# Patient Record
Sex: Female | Born: 1970
Health system: Southern US, Community
[De-identification: ages and names within clinical notes are randomized; demographics above are authoritative.]

## PROBLEM LIST (undated history)

## (undated) DIAGNOSIS — Z973 Presence of spectacles and contact lenses: Secondary | ICD-10-CM

## (undated) DIAGNOSIS — N939 Abnormal uterine and vaginal bleeding, unspecified: Secondary | ICD-10-CM

## (undated) DIAGNOSIS — D259 Leiomyoma of uterus, unspecified: Secondary | ICD-10-CM

## (undated) HISTORY — PX: AUGMENTATION MAMMAPLASTY: SUR837

---

## 1997-07-24 ENCOUNTER — Other Ambulatory Visit: Admission: RE | Admit: 1997-07-24 | Discharge: 1997-07-24 | Payer: Self-pay | Admitting: Obstetrics and Gynecology

## 1997-10-23 ENCOUNTER — Other Ambulatory Visit: Admission: RE | Admit: 1997-10-23 | Discharge: 1997-10-23 | Payer: Self-pay | Admitting: Obstetrics and Gynecology

## 1998-06-21 ENCOUNTER — Other Ambulatory Visit: Admission: RE | Admit: 1998-06-21 | Discharge: 1998-06-21 | Payer: Self-pay | Admitting: Obstetrics and Gynecology

## 1999-07-03 ENCOUNTER — Other Ambulatory Visit: Admission: RE | Admit: 1999-07-03 | Discharge: 1999-07-03 | Payer: Self-pay | Admitting: Obstetrics and Gynecology

## 2000-07-15 ENCOUNTER — Ambulatory Visit (HOSPITAL_COMMUNITY): Admission: RE | Admit: 2000-07-15 | Discharge: 2000-07-15 | Payer: Self-pay | Admitting: Obstetrics and Gynecology

## 2000-08-04 ENCOUNTER — Other Ambulatory Visit: Admission: RE | Admit: 2000-08-04 | Discharge: 2000-08-04 | Payer: Self-pay | Admitting: Obstetrics and Gynecology

## 2001-02-25 ENCOUNTER — Inpatient Hospital Stay (HOSPITAL_COMMUNITY): Admission: AD | Admit: 2001-02-25 | Discharge: 2001-02-28 | Payer: Self-pay | Admitting: Obstetrics and Gynecology

## 2001-04-12 ENCOUNTER — Other Ambulatory Visit: Admission: RE | Admit: 2001-04-12 | Discharge: 2001-04-12 | Payer: Self-pay | Admitting: Obstetrics and Gynecology

## 2002-03-24 ENCOUNTER — Other Ambulatory Visit: Admission: RE | Admit: 2002-03-24 | Discharge: 2002-03-24 | Payer: Self-pay | Admitting: Obstetrics and Gynecology

## 2002-07-14 ENCOUNTER — Inpatient Hospital Stay (HOSPITAL_COMMUNITY): Admission: AD | Admit: 2002-07-14 | Discharge: 2002-07-14 | Payer: Self-pay | Admitting: Obstetrics and Gynecology

## 2002-10-16 ENCOUNTER — Inpatient Hospital Stay (HOSPITAL_COMMUNITY): Admission: AD | Admit: 2002-10-16 | Discharge: 2002-10-18 | Payer: Self-pay | Admitting: Obstetrics and Gynecology

## 2002-12-01 ENCOUNTER — Other Ambulatory Visit: Admission: RE | Admit: 2002-12-01 | Discharge: 2002-12-01 | Payer: Self-pay | Admitting: Obstetrics and Gynecology

## 2004-05-29 ENCOUNTER — Other Ambulatory Visit: Admission: RE | Admit: 2004-05-29 | Discharge: 2004-05-29 | Payer: Self-pay | Admitting: Obstetrics and Gynecology

## 2004-08-15 ENCOUNTER — Encounter: Admission: RE | Admit: 2004-08-15 | Discharge: 2004-08-15 | Payer: Self-pay | Admitting: Obstetrics and Gynecology

## 2006-07-31 ENCOUNTER — Encounter: Admission: RE | Admit: 2006-07-31 | Discharge: 2006-07-31 | Payer: Self-pay | Admitting: Obstetrics and Gynecology

## 2007-03-04 HISTORY — PX: INGUINAL HERNIA REPAIR: SUR1180

## 2011-03-04 HISTORY — PX: BREAST ENHANCEMENT SURGERY: SHX7

## 2011-05-19 ENCOUNTER — Encounter: Payer: Self-pay | Admitting: Family

## 2011-05-19 ENCOUNTER — Ambulatory Visit (INDEPENDENT_AMBULATORY_CARE_PROVIDER_SITE_OTHER): Payer: BC Managed Care – PPO | Admitting: Family

## 2011-05-19 VITALS — BP 100/60 | Ht 62.0 in | Wt 106.0 lb

## 2011-05-19 DIAGNOSIS — R634 Abnormal weight loss: Secondary | ICD-10-CM

## 2011-05-19 DIAGNOSIS — F4389 Other reactions to severe stress: Secondary | ICD-10-CM

## 2011-05-19 DIAGNOSIS — F438 Other reactions to severe stress: Secondary | ICD-10-CM

## 2011-05-19 DIAGNOSIS — F430 Acute stress reaction: Secondary | ICD-10-CM

## 2011-05-19 LAB — CBC
HCT: 41.9 % (ref 36.0–46.0)
MCHC: 34 g/dL (ref 30.0–36.0)
MCV: 102.4 fl — ABNORMAL HIGH (ref 78.0–100.0)
RDW: 12.4 % (ref 11.5–14.6)

## 2011-05-19 LAB — BASIC METABOLIC PANEL
CO2: 30 mEq/L (ref 19–32)
Calcium: 9.3 mg/dL (ref 8.4–10.5)
Chloride: 103 mEq/L (ref 96–112)
GFR: 82.77 mL/min (ref >60.00)
Glucose, Bld: 69 mg/dL — ABNORMAL LOW (ref 70–99)
Sodium: 139 mEq/L (ref 135–145)

## 2011-05-19 NOTE — Patient Instructions (Addendum)
Attention Deficit Hyperactivity Disorder Attention deficit hyperactivity disorder (ADHD) is a problem with behavior issues based on the way the brain functions (neurobehavioral disorder). It is a common reason for behavior and academic problems in school. CAUSES  The cause of ADHD is unknown in most cases. It may run in families. It sometimes can be associated with learning disabilities and other behavioral problems. SYMPTOMS  There are 3 types of ADHD. The 3 types and some of the symptoms include:  Inattentive   Gets bored or distracted easily.   Loses or forgets things. Forgets to hand in homework.   Has trouble organizing or completing tasks.   Difficulty staying on task.   An inability to organize daily tasks and school work.   Leaving projects, chores, or homework unfinished.   Trouble paying attention or responding to details. Careless mistakes.   Difficulty following directions. Often seems like is not listening.   Dislikes activities that require sustained attention (like chores or homework).   Hyperactive-impulsive   Feels like it is impossible to sit still or stay in a seat. Fidgeting with hands and feet.   Trouble waiting turn.   Talking too much or out of turn. Interruptive.   Speaks or acts impulsively.   Aggressive, disruptive behavior.   Constantly busy or on the go, noisy.   Combined   Has symptoms of both of the above.  Often children with ADHD feel discouraged about themselves and with school. They often perform well below their abilities in school. These symptoms can cause problems in home, school, and in relationships with peers. As children get older, the excess motor activities can calm down, but the problems with paying attention and staying organized persist. Most children do not outgrow ADHD but with good treatment can learn to cope with the symptoms. DIAGNOSIS  When ADHD is suspected, the diagnosis should be made by professionals trained in  ADHD.  Diagnosis will include:  Ruling out other reasons for the child's behavior.   The caregivers will check with the child's school and check their medical records.   They will talk to teachers and parents.   Behavior rating scales for the child will be filled out by those dealing with the child on a daily basis.  A diagnosis is made only after all information has been considered. TREATMENT  Treatment usually includes behavioral treatment often along with medicines. It may include stimulant medicines. The stimulant medicines decrease impulsivity and hyperactivity and increase attention. Other medicines used include antidepressants and certain blood pressure medicines. Most experts agree that treatment for ADHD should address all aspects of the child's functioning. Treatment should not be limited to the use of medicines alone. Treatment should include structured classroom management. The parents must receive education to address rewarding good behavior, discipline, and limit-setting. Tutoring or behavioral therapy or both should be available for the child. If untreated, the disorder can have long-term serious effects into adolescence and adulthood. HOME CARE INSTRUCTIONS   Often with ADHD there is a lot of frustration among the family in dealing with the illness. There is often blame and anger that is not warranted. This is a life long illness. There is no way to prevent ADHD. In many cases, because the problem affects the family as a whole, the entire family may need help. A therapist can help the family find better ways to handle the disruptive behaviors and promote change. If the child is young, most of the therapist's work is with the parents. Parents will   learn techniques for coping with and improving their child's behavior. Sometimes only the child with the ADHD needs counseling. Your caregivers can help you make these decisions.   Children with ADHD may need help in organizing. Some  helpful tips include:   Keep routines the same every day from wake-up time to bedtime. Schedule everything. This includes homework and playtime. This should include outdoor and indoor recreation. Keep the schedule on the refrigerator or a bulletin board where it is frequently seen. Mark schedule changes as far in advance as possible.   Have a place for everything and keep everything in its place. This includes clothing, backpacks, and school supplies.   Encourage writing down assignments and bringing home needed books.   Offer your child a well-balanced diet. Breakfast is especially important for school performance. Children should avoid drinks with caffeine including:   Soft drinks.   Coffee.   Tea.   However, some older children (adolescents) may find these drinks helpful in improving their attention.   Children with ADHD need consistent rules that they can understand and follow. If rules are followed, give small rewards. Children with ADHD often receive, and expect, criticism. Look for good behavior and praise it. Set realistic goals. Give clear instructions. Look for activities that can foster success and self-esteem. Make time for pleasant activities with your child. Give lots of affection.   Parents are their children's greatest advocates. Learn as much as possible about ADHD. This helps you become a stronger and better advocate for your child. It also helps you educate your child's teachers and instructors if they feel inadequate in these areas. Parent support groups are often helpful. A national group with local chapters is called CHADD (Children and Adults with Attention Deficit Hyperactivity Disorder).  PROGNOSIS  There is no cure for ADHD. Children with the disorder seldom outgrow it. Many find adaptive ways to accommodate the ADHD as they mature. SEEK MEDICAL CARE IF:  Your child has repeated muscle twitches, cough or speech outbursts.   Your child has sleep problems.   Your  child has a marked loss of appetite.   Your child develops depression.   Your child has new or worsening behavioral problems.   Your child develops dizziness.   Your child has a racing heart.   Your child has stomach pains.   Your child develops headaches.  Document Released: 02/07/2002 Document Revised: 02/06/2011 Document Reviewed: 09/20/2007 Exit[employer] Patient Information 2012 [employer].   Anxiety and Panic Attacks Your caregiver has informed you that you are having an anxiety or panic attack. There may be many forms of this. Most of the time these attacks come suddenly and without warning. They come at any time of day, including periods of sleep, and at any time of life. They may be strong and unexplained. Although panic attacks are very scary, they are physically harmless. Sometimes the cause of your anxiety is not known. Anxiety is a protective mechanism of the body in its fight or flight mechanism. Most of these perceived danger situations are actually nonphysical situations (such as anxiety over losing a job). CAUSES  The causes of an anxiety or panic attack are many. Panic attacks may occur in otherwise healthy people given a certain set of circumstances. There may be a genetic cause for panic attacks. Some medications may also have anxiety as a side effect. SYMPTOMS  Some of the most common feelings are:  Intense terror.   Dizziness, feeling faint.   Hot and cold flashes.     Fear of going crazy.   Feelings that nothing is real.   Sweating.   Shaking.   Chest pain or a fast heartbeat (palpitations).   Smothering, choking sensations.   Feelings of impending doom and that death is near.   Tingling of extremities, this may be from over-breathing.   Altered reality (derealization).   Being detached from yourself (depersonalization).  Several symptoms can be present to make up anxiety or panic attacks. DIAGNOSIS  The evaluation by your caregiver will depend  on the type of symptoms you are experiencing. The diagnosis of anxiety or panic attack is made when no physical illness can be determined to be a cause of the symptoms. TREATMENT  Treatment to prevent anxiety and panic attacks may include:  Avoidance of circumstances that cause anxiety.   Reassurance and relaxation.   Regular exercise.   Relaxation therapies, such as yoga.   Psychotherapy with a psychiatrist or therapist.   Avoidance of caffeine, alcohol and illegal drugs.   Prescribed medication.  SEEK IMMEDIATE MEDICAL CARE IF:   You experience panic attack symptoms that are different than your usual symptoms.   You have any worsening or concerning symptoms.  Document Released: 02/17/2005 Document Revised: 02/06/2011 Document Reviewed: 06/21/2009 ExitCare Patient Information 2012 [employer]. 

## 2011-05-19 NOTE — Progress Notes (Signed)
  Subjective:    Patient ID: Sherry Mclean, female    DOB: 09/02/1970, 41 y.o.   MRN: [id number]  HPI 41 year old white female, nonsmoker, patient to the practice this and to discuss concerns possible attention deficit hyperactivity disorder. She reports going through some marital issues and her husband has verbalized he believes that she is too hyper. Reports being high-energy. Was never characterized his ADHD as a child. 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 Has no difficulty completing tasks at work or home. 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 Her husband and she separated 6 weeks ago. She is seeking help to address whatever her mood disorder may be. The patient does report recent weight loss of about 10 pounds. She attributed to the stress of her failing marriage. She routinely sees a Veterinary surgeon. She also sees a marriage counter. Patient will like to explore non-pharmacological ways to treat her mood disorder. Denies any helplessness, hopelessness, thoughts of death or dying.   Review of Systems  Respiratory: Negative.   Gastrointestinal: Negative.   Musculoskeletal: Negative.   Neurological: Negative.   Hematological: Negative.   Psychiatric/Behavioral: The patient is nervous/anxious and is hyperactive.    No past medical history on file.  History   Social History  . Marital Status: Single    Spouse Name: N/A    Number of Children: N/A  . Years of Education: N/A   Occupational History  . Not on file.   Social History Main Topics  . Smoking status: Never Smoker   . Smokeless tobacco: Not on file  . Alcohol Use: Yes     glass of wine nightly  . Drug Use: No  . Sexually Active: Not on file   Other Topics Concern  . Not on file   Social History Narrative  . No narrative on file    Past Surgical History  Procedure Date  . Hernia repair     No family history on file.  No Known Allergies  No current outpatient prescriptions on file prior to  visit.    BP 100/60  Ht 5\' 2"  (1.575 m)  Wt 106 lb (48.081 kg)  BMI 19.39 kg/m2chart    Objective:   Physical Exam  Constitutional: She is oriented to person, place, and time. She appears well-developed and well-nourished.  Neck: Normal range of motion. Neck supple.  Cardiovascular: Normal rate, regular rhythm and normal heart sounds.   Pulmonary/Chest: Effort normal and breath sounds normal.  Abdominal: Soft. Bowel sounds are normal.  Musculoskeletal: Normal range of motion.  Neurological: She is alert and oriented to person, place, and time.  Skin: Skin is warm and dry.  Psychiatric:       Appears anxious, and high-energy.          Assessment & Plan:  Assessment: Acute stress reaction, weight loss  Plan: I discussed at length attention deficit hyperactivity disorder and anxiety. Patient is very high strung. I believe that her symptoms more closely correlate with anxiety and ADD. I verbalized this to her. She will talk to her psychologist about this as well and will struck a plan for her. In the meantime, labs and to include TSH, BMP, CBC and B12. Will notify patient of results. Encouraged healthy diet and exercise. Avoid caffeine. Followup pending labs.

## 2011-05-20 ENCOUNTER — Telehealth: Payer: Self-pay | Admitting: Family

## 2011-05-20 NOTE — Telephone Encounter (Signed)
Pt called re: her ov on Friday at 3pm. Pt would like to discuss this will doctor prior to coming in for ov.

## 2011-05-21 NOTE — Telephone Encounter (Signed)
Spoke with patient. She is bringing her husband to the appointment but does not want the details of their separation addressed and her OV.

## 2011-05-23 ENCOUNTER — Ambulatory Visit (INDEPENDENT_AMBULATORY_CARE_PROVIDER_SITE_OTHER): Payer: BC Managed Care – PPO | Admitting: Family

## 2011-05-23 ENCOUNTER — Encounter: Payer: Self-pay | Admitting: Family

## 2011-05-23 VITALS — BP 120/80 | HR 91 | Temp 98.5°F

## 2011-05-23 DIAGNOSIS — F411 Generalized anxiety disorder: Secondary | ICD-10-CM

## 2011-05-23 DIAGNOSIS — F419 Anxiety disorder, unspecified: Secondary | ICD-10-CM

## 2011-05-23 NOTE — Progress Notes (Signed)
  Subjective:    Patient ID: Sherry Mclean, female    DOB: October 18, 1970, 41 y.o.   MRN: [id number]  HPI 41 year old white female, nonsmoker is in for recheck of acute stress reaction and anxiety. She has her husband here accompanying her who also has concerns of her being high stress. Husband also reports that she takes are multiple to ask, and can often feel overwhelmed then not complete the task. Overall, she is very meticulous person and very organized. I had an opportunity to speak with her therapist, who has diagnosed her with generalized anxiety disorder, and has recommended an SSRI.   Review of Systems  HENT: Negative.   Respiratory: Negative.   Cardiovascular: Negative.   Neurological: Negative.   Hematological: Negative.   Psychiatric/Behavioral: The patient is nervous/anxious.    No past medical history on file.  History   Social History  . Marital Status: Single    Spouse Name: N/A    Number of Children: N/A  . Years of Education: N/A   Occupational History  . Not on file.   Social History Main Topics  . Smoking status: Never Smoker   . Smokeless tobacco: Not on file  . Alcohol Use: Yes     glass of wine nightly  . Drug Use: No  . Sexually Active: Not on file   Other Topics Concern  . Not on file   Social History Narrative  . No narrative on file    Past Surgical History  Procedure Date  . Hernia repair     No family history on file.  No Known Allergies  Current Outpatient Prescriptions on File Prior to Visit  Medication Sig Dispense Refill  . norethindrone-ethinyl estradiol (JUNEL FE,GILDESS FE,LOESTRIN FE) 1-20 MG-MCG tablet Take 1 tablet by mouth daily.        BP 120/80  Pulse 91  Temp(Src) 98.5 F (36.9 C) (Oral)  SpO2 99%chart    Objective:   Physical Exam  Constitutional: She is oriented to person, place, and time. She appears well-developed and well-nourished.  Neck: Normal range of motion. Neck supple.  Cardiovascular: Normal rate  and normal heart sounds.   Pulmonary/Chest: Effort normal and breath sounds normal.  Musculoskeletal: Normal range of motion.  Neurological: She is alert and oriented to person, place, and time.  Skin: Skin is warm and dry.  Psychiatric: She has a normal mood and affect.          Assessment & Plan:  Assessment: Anxiety, acute stress reaction  Plan: After speaking with her and her husband, we have decided to try Pristiq 50 mg once a day. We'll bring patient back for recheck up to a half weeks. Advised to call with any questions or concerns. Recheck as discussed and when necessary sooner.

## 2011-05-27 ENCOUNTER — Telehealth: Payer: Self-pay

## 2011-05-27 NOTE — Telephone Encounter (Signed)
Pt called to c/o not being able to continue with the samples of Pristiq provided to her by PCP. She states that it made her feel "out of sorts", unsafe, tired, lots of yawning, and it felt like her throat was swollen or closing up. Pt notes that her throat was not sore or hurting. I advised pt to definitely d/c use of Pristiq and discuss other tx options with PCP at next OV. Pt verbalized understanding.

## 2011-05-27 NOTE — Telephone Encounter (Signed)
She may try a different medication. We can start a low dose, Celexa 10mg  QD if she is willing. #30 1 rf.

## 2011-05-28 NOTE — Telephone Encounter (Signed)
Pt stated that she would like to do more research on the active ingredients and side effects of Celexa before making a decision on starting medication. She will call back with her decision or wait to speak with PCP about it at next OV

## 2011-06-05 ENCOUNTER — Ambulatory Visit: Payer: Self-pay | Admitting: Family Medicine

## 2011-06-11 ENCOUNTER — Ambulatory Visit (INDEPENDENT_AMBULATORY_CARE_PROVIDER_SITE_OTHER): Payer: BC Managed Care – PPO | Admitting: Family

## 2011-06-11 ENCOUNTER — Encounter: Payer: Self-pay | Admitting: Family

## 2011-06-11 VITALS — BP 110/60 | Temp 98.6°F | Wt 104.0 lb

## 2011-06-11 DIAGNOSIS — F411 Generalized anxiety disorder: Secondary | ICD-10-CM

## 2011-06-11 DIAGNOSIS — F419 Anxiety disorder, unspecified: Secondary | ICD-10-CM

## 2011-06-11 NOTE — Progress Notes (Signed)
  Subjective:    Patient ID: Sherry Mclean, female    DOB: [birth year], 41 y.o.   MRN: [id number]  HPI 41 year old white female, nonsmoker is in for recheck of anxiety. She was started on Pristiq 50 mg once a day. Patient reports she did not tolerate the medication well. Reports side effects of fatigue, yawning, dilated pupils, and overall feeling drunk. She discontinued the medication after 3 doses. She wishes to try to control her anxiety her psychotherapy. She believes that she is in a better place today. She denies any feeling of helplessness, hopelessness, thoughts of death or dying.   Review of Systems  Constitutional: Negative.   Respiratory: Negative.   Cardiovascular: Negative.   Skin: Negative.   Neurological: Negative.   Hematological: Negative.   Psychiatric/Behavioral: Negative.        Objective:   Physical Exam  Constitutional: She is oriented to person, place, and time. She appears well-developed and well-nourished.  Cardiovascular: Normal rate, regular rhythm and normal heart sounds.   Pulmonary/Chest: Effort normal and breath sounds normal.  Neurological: She is alert and oriented to person, place, and time.  Skin: Skin is warm and dry.  Psychiatric: She has a normal mood and affect.          Assessment & Plan:  Assessment: Anxiety  Plan: Continue psychotherapy. Call the office if she decides to try another SSRI. Recheck when necessary. Encouraged exercise. No past medical history on file.  History   Social History  . Marital Status: Single    Spouse Name: N/A    Number of Children: N/A  . Years of Education: N/A   Occupational History  . Not on file.   Social History Main Topics  . Smoking status: Never Smoker   . Smokeless tobacco: Not on file  . Alcohol Use: Yes     glass of wine nightly  . Drug Use: No  . Sexually Active: Not on file   Other Topics Concern  . Not on file   Social History Narrative  . No narrative on file    Past  Surgical History  Procedure Date  . Hernia repair     No family history on file.  Allergies  Allergen Reactions  . Pristiq (Desvenlafaxine Succinate Monohydrate) Other (See Comments)    Pt states that her throat felt like it was swollen or "closing up"    Current Outpatient Prescriptions on File Prior to Visit  Medication Sig Dispense Refill  . norethindrone-ethinyl estradiol (JUNEL FE,GILDESS FE,LOESTRIN FE) 1-20 MG-MCG tablet Take 1 tablet by mouth daily.        BP 110/60  Temp(Src) 98.6 F (37 C) (Oral)  Wt 104 lb (47.174 kg)chart

## 2013-04-19 ENCOUNTER — Encounter (HOSPITAL_COMMUNITY): Payer: Self-pay

## 2013-05-02 NOTE — H&P (Signed)
Sherry Mclean  DICTATION # [id number] CSN# [id number]   Margarette Asal, MD 05/02/2013 2:12 PM

## 2013-05-03 NOTE — H&P (Signed)
NAMEMURLENE, REVELL NO.:  [id number]  MEDICAL RECORD NO.:  [id number]  LOCATION:                                 FACILITY:  PHYSICIAN:  Ralene Bathe. Matthew Saras, M.D.DATE OF BIRTH:  27-Jun-1970  DATE OF ADMISSION: DATE OF DISCHARGE:                             HISTORY & PHYSICAL   CHIEF COMPLAINT:  Menorrhagia.  HISTORY OF PRESENT ILLNESS:  A 43 year old, G2, P2, currently on Lo Loestrin, the patient is currently separated and has two children, 11 and 9, was seen recently, complaining of heavy irregular bleeding despite being on OCPs.  Sonohysterogram was carried out in our office on April 16, 2013, that showed multiple fibroids, 1.6, 1.9, 1.0.  On saline infusion, there was a well-defined mostly submucosal 2.8 x 1.4-cm intracavitary fibroid.  We discussed the number of treatment options with her arranging from definitive hysterectomy to [facility] resection of submucous fibroid.  She would prefer the latter.  This was discussed in detail including specific risk related to bleeding, infection, other complications such as perforation that may require additional surgery, the fact that the other fibroids could become an issue as far as pain, bleeding, or pressure later.  She understands that the main submucosal fibroid is the most likely problematic for causing her bleeding.  PAST MEDICAL HISTORY:  ALLERGIES:  BETADINE, AMOXICILLIN, AUGMENTIN.  CURRENT MEDICATIONS:  Lo Loestrin.  SURGICAL HISTORY:  She has had a hernia repair, breast augmentation in 2013.  Two vaginal deliveries.  REVIEW OF SYSTEMS:  Significant for hiatal hernia.  FAMILY HISTORY:  Significant for grandmother with unspecified cancer and her father was diagnosed in 2013, with colon cancer.  SOCIAL HISTORY:  She is separated.  Denies tobacco or drug use.  She does drink two alcoholic drinks per day.  PHYSICAL EXAMINATION:  VITAL SIGNS:  Temperature 98.2, blood pressure 102/70. HEENT:   Unremarkable. NECK:  Supple without masses. LUNGS:  Clear. CARDIOVASCULAR:  Regular rate and rhythm without murmurs, rubs, or gallops. BREASTS:  Reveals implants no masses. ABDOMEN:  Soft, flat, nontender.  Vulva, vagina, cervix normal.  Uterus is upper limit of normal size.  Adnexa negative. EXTREMITIES:  Unremarkable. NEUROLOGIC:  Unremarkable.  IMPRESSION:  Abnormal bleeding related to leiomyoma, submucous fibroid is noted.  PLAN:  D and C with TRUCLEAR resection of submucous fibroid.  Procedure and risks were discussed as above.     Darnella Zeiter M. Matthew Saras, M.D.     [facility]  D:  05/02/2013  T:  05/03/2013  Job:  [id number]

## 2013-05-04 ENCOUNTER — Encounter (HOSPITAL_COMMUNITY): Payer: Self-pay | Admitting: Pharmacist

## 2013-05-13 ENCOUNTER — Ambulatory Visit (HOSPITAL_COMMUNITY)
Admission: RE | Admit: 2013-05-13 | Discharge: 2013-05-13 | Disposition: A | Discharge status: Home / Self Care | Payer: BC Managed Care – PPO | Source: Ambulatory Visit | Attending: Obstetrics and Gynecology | Admitting: Obstetrics and Gynecology

## 2013-05-13 ENCOUNTER — Encounter (HOSPITAL_COMMUNITY): Payer: BC Managed Care – PPO | Admitting: Anesthesiology

## 2013-05-13 ENCOUNTER — Encounter (HOSPITAL_COMMUNITY)
Admission: RE | Disposition: A | Discharge status: Home / Self Care | Payer: Self-pay | Source: Ambulatory Visit | Attending: Obstetrics and Gynecology

## 2013-05-13 ENCOUNTER — Ambulatory Visit (HOSPITAL_COMMUNITY): Payer: BC Managed Care – PPO | Admitting: Anesthesiology

## 2013-05-13 ENCOUNTER — Encounter (HOSPITAL_COMMUNITY): Payer: Self-pay | Admitting: General Practice

## 2013-05-13 DIAGNOSIS — N920 Excessive and frequent menstruation with regular cycle: Secondary | ICD-10-CM | POA: Insufficient documentation

## 2013-05-13 DIAGNOSIS — D250 Submucous leiomyoma of uterus: Secondary | ICD-10-CM | POA: Insufficient documentation

## 2013-05-13 DIAGNOSIS — K449 Diaphragmatic hernia without obstruction or gangrene: Secondary | ICD-10-CM | POA: Insufficient documentation

## 2013-05-13 HISTORY — PX: DILATATION & CURETTAGE/HYSTEROSCOPY WITH TRUECLEAR: SHX6353

## 2013-05-13 LAB — CBC
HCT: 37.7 % (ref 36.0–46.0)
HEMOGLOBIN: 13.1 g/dL (ref 12.0–15.0)
MCH: 34.8 pg — ABNORMAL HIGH (ref 26.0–34.0)
MCHC: 34.7 g/dL (ref 30.0–36.0)
MCV: 100.3 fL — ABNORMAL HIGH (ref 78.0–100.0)
Platelets: 173 10*3/uL (ref 150–400)
RBC: 3.76 MIL/uL — ABNORMAL LOW (ref 3.87–5.11)
RDW: 12 % (ref 11.5–15.5)
WBC: 5.6 10*3/uL (ref 4.0–10.5)

## 2013-05-13 LAB — PREGNANCY, URINE: Preg Test, Ur: NEGATIVE

## 2013-05-13 SURGERY — DILATATION & CURETTAGE/HYSTEROSCOPY WITH TRUCLEAR
Anesthesia: General | Site: Vagina

## 2013-05-13 MED ORDER — LACTATED RINGERS IV SOLN
Freq: Once | INTRAVENOUS | Status: AC
  Administered 2013-05-13 (×2): via INTRAVENOUS

## 2013-05-13 MED ORDER — KETOROLAC TROMETHAMINE 30 MG/ML IJ SOLN: 15.0000 mg | Freq: Once | INTRAMUSCULAR | Status: DC | PRN

## 2013-05-13 MED ORDER — PROMETHAZINE HCL 25 MG/ML IJ SOLN
6.2500 mg | INTRAMUSCULAR | Status: DC | PRN
  Administered 2013-05-13: 6.25 mg via INTRAVENOUS

## 2013-05-13 MED ORDER — LIDOCAINE HCL 1 % IJ SOLN
INTRAMUSCULAR | Status: AC
  Filled 2013-05-13: qty 20

## 2013-05-13 MED ORDER — DEXAMETHASONE SODIUM PHOSPHATE 10 MG/ML IJ SOLN
INTRAMUSCULAR | Status: AC
  Filled 2013-05-13: qty 1

## 2013-05-13 MED ORDER — GLYCOPYRROLATE 0.2 MG/ML IJ SOLN
INTRAMUSCULAR | Status: AC
  Filled 2013-05-13: qty 1

## 2013-05-13 MED ORDER — GLYCOPYRROLATE 0.2 MG/ML IJ SOLN
INTRAMUSCULAR | Status: DC | PRN
  Administered 2013-05-13 (×2): 0.1 mg via INTRAVENOUS

## 2013-05-13 MED ORDER — FENTANYL CITRATE 0.05 MG/ML IJ SOLN
INTRAMUSCULAR | Status: AC
  Filled 2013-05-13: qty 2

## 2013-05-13 MED ORDER — SODIUM CHLORIDE 0.9 % IR SOLN
Status: DC | PRN
Start: 2013-05-13 — End: 2013-05-13
  Administered 2013-05-13: 3000 mL

## 2013-05-13 MED ORDER — ONDANSETRON HCL 4 MG/2ML IJ SOLN
INTRAMUSCULAR | Status: DC | PRN
  Administered 2013-05-13: 4 mg via INTRAVENOUS

## 2013-05-13 MED ORDER — MEPERIDINE HCL 25 MG/ML IJ SOLN: 6.2500 mg | INTRAMUSCULAR | Status: DC | PRN

## 2013-05-13 MED ORDER — KETOROLAC TROMETHAMINE 30 MG/ML IJ SOLN
INTRAMUSCULAR | Status: AC
  Filled 2013-05-13: qty 1

## 2013-05-13 MED ORDER — IBUPROFEN 800 MG PO TABS: 800.0000 mg | ORAL_TABLET | Freq: Three times a day (TID) | ORAL | Status: DC | PRN

## 2013-05-13 MED ORDER — FENTANYL CITRATE 0.05 MG/ML IJ SOLN
INTRAMUSCULAR | Status: DC | PRN
  Administered 2013-05-13: 25 ug via INTRAVENOUS
  Administered 2013-05-13: 50 ug via INTRAVENOUS
  Administered 2013-05-13: 25 ug via INTRAVENOUS

## 2013-05-13 MED ORDER — GENTAMICIN SULFATE 40 MG/ML IJ SOLN
INTRAVENOUS | Status: AC
  Administered 2013-05-13: 100 mL via INTRAVENOUS
  Filled 2013-05-13: qty 6.25

## 2013-05-13 MED ORDER — ONDANSETRON HCL 4 MG/2ML IJ SOLN
INTRAMUSCULAR | Status: AC
  Filled 2013-05-13: qty 2

## 2013-05-13 MED ORDER — LIDOCAINE HCL (CARDIAC) 20 MG/ML IV SOLN
INTRAVENOUS | Status: DC | PRN
  Administered 2013-05-13: 30 mg via INTRAVENOUS

## 2013-05-13 MED ORDER — LIDOCAINE HCL 1 % IJ SOLN
INTRAMUSCULAR | Status: DC | PRN
  Administered 2013-05-13: 5 mL

## 2013-05-13 MED ORDER — DEXAMETHASONE SODIUM PHOSPHATE 10 MG/ML IJ SOLN
INTRAMUSCULAR | Status: DC | PRN
  Administered 2013-05-13: 5 mg via INTRAVENOUS

## 2013-05-13 MED ORDER — MIDAZOLAM HCL 2 MG/2ML IJ SOLN
INTRAMUSCULAR | Status: AC
  Filled 2013-05-13: qty 2

## 2013-05-13 MED ORDER — PROPOFOL 10 MG/ML IV BOLUS
INTRAVENOUS | Status: DC | PRN
  Administered 2013-05-13: 150 mg via INTRAVENOUS

## 2013-05-13 MED ORDER — MIDAZOLAM HCL 2 MG/2ML IJ SOLN
INTRAMUSCULAR | Status: DC | PRN
  Administered 2013-05-13: 2 mg via INTRAVENOUS

## 2013-05-13 MED ORDER — FENTANYL CITRATE 0.05 MG/ML IJ SOLN
INTRAMUSCULAR | Status: AC
  Administered 2013-05-13: 50 ug via INTRAVENOUS
  Filled 2013-05-13: qty 2

## 2013-05-13 MED ORDER — KETOROLAC TROMETHAMINE 30 MG/ML IJ SOLN
INTRAMUSCULAR | Status: DC | PRN
  Administered 2013-05-13: 30 mg via INTRAVENOUS

## 2013-05-13 MED ORDER — PROMETHAZINE HCL 25 MG/ML IJ SOLN
INTRAMUSCULAR | Status: AC
  Administered 2013-05-13: 6.25 mg via INTRAVENOUS
  Filled 2013-05-13: qty 1

## 2013-05-13 MED ORDER — HYDROCODONE-IBUPROFEN 7.5-200 MG PO TABS: 1.0000 | ORAL_TABLET | Freq: Three times a day (TID) | ORAL | Status: DC | PRN

## 2013-05-13 MED ORDER — MIDAZOLAM HCL 2 MG/2ML IJ SOLN: 0.5000 mg | Freq: Once | INTRAMUSCULAR | Status: DC | PRN

## 2013-05-13 MED ORDER — GENTAMICIN SULFATE 40 MG/ML IJ SOLN: INTRAMUSCULAR | Status: DC

## 2013-05-13 MED ORDER — PROPOFOL 10 MG/ML IV EMUL
INTRAVENOUS | Status: AC
  Filled 2013-05-13: qty 20

## 2013-05-13 MED ORDER — LIDOCAINE HCL (CARDIAC) 20 MG/ML IV SOLN
INTRAVENOUS | Status: AC
  Filled 2013-05-13: qty 5

## 2013-05-13 MED ORDER — FENTANYL CITRATE 0.05 MG/ML IJ SOLN
25.0000 ug | INTRAMUSCULAR | Status: DC | PRN
Start: 2013-05-13 — End: 2013-05-13
  Administered 2013-05-13: 25 ug via INTRAVENOUS
  Administered 2013-05-13: 50 ug via INTRAVENOUS

## 2013-05-13 SURGICAL SUPPLY — 20 items
BLADE INCISOR TRUC PLUS 2.9 (ABLATOR) IMPLANT
CANISTERS HI-FLOW 3000CC (CANNISTER) IMPLANT
CATH ROBINSON RED A/P 16FR (CATHETERS) ×3 IMPLANT
CLOTH BEACON ORANGE TIMEOUT ST (SAFETY) ×3 IMPLANT
CONTAINER PREFILL 10% NBF 60ML (FORM) ×6 IMPLANT
DRAPE HYSTEROSCOPY (DRAPE) ×3 IMPLANT
DRSG TELFA 3X8 NADH (GAUZE/BANDAGES/DRESSINGS) ×3 IMPLANT
GLOVE BIO SURGEON STRL SZ7 (GLOVE) ×6 IMPLANT
GOWN STRL REUS W/TWL LRG LVL3 (GOWN DISPOSABLE) ×6 IMPLANT
INCISOR TRUC PLUS BLADE 2.9 (ABLATOR)
KIT HYSTEROSCOPY TRUCLEAR (ABLATOR) IMPLANT
MORCELLATOR RECIP TRUCLEAR 4.0 (ABLATOR) ×2 IMPLANT
NDL SPNL 22GX3.5 QUINCKE BK (NEEDLE) ×1 IMPLANT
NEEDLE SPNL 22GX3.5 QUINCKE BK (NEEDLE) ×3 IMPLANT
PACK VAGINAL MINOR WOMEN LF (CUSTOM PROCEDURE TRAY) ×3 IMPLANT
PAD DRESSING TELFA 3X8 NADH (GAUZE/BANDAGES/DRESSINGS) ×1 IMPLANT
PAD OB MATERNITY 4.3X12.25 (PERSONAL CARE ITEMS) ×3 IMPLANT
SYR CONTROL 10ML LL (SYRINGE) ×3 IMPLANT
TOWEL OR 17X24 6PK STRL BLUE (TOWEL DISPOSABLE) ×6 IMPLANT
WATER STERILE IRR 1000ML POUR (IV SOLUTION) ×3 IMPLANT

## 2013-05-13 NOTE — Progress Notes (Signed)
The patient was re-examined with no change in status 

## 2013-05-13 NOTE — Transfer of Care (Signed)
Immediate Anesthesia Transfer of Care Note  Patient: Sherry Mclean  Procedure(s) Performed: Procedure(s): DILATATION & CURETTAGE/HYSTEROSCOPY WITH TRUCLEAR (N/A)  Patient Location: PACU  Anesthesia Type:General  Level of Consciousness: awake and alert   Airway & Oxygen Therapy: Patient Spontanous Breathing and Patient connected to nasal cannula oxygen  Post-op Assessment: Report given to PACU RN and Post -op Vital signs reviewed and stable  Post vital signs: Reviewed and stable  Complications: No apparent anesthesia complications

## 2013-05-13 NOTE — Anesthesia Preprocedure Evaluation (Signed)
Anesthesia Evaluation  Patient identified by MRN, date of birth, ID band Patient awake    Reviewed: Allergy & Precautions, H&P , Patient's Chart, lab work & pertinent test results, reviewed documented beta blocker date and time   History of Anesthesia Complications Negative for: history of anesthetic complications  Airway Mallampati: II TM Distance: >3 FB Neck ROM: full    Dental   Pulmonary  breath sounds clear to auscultation        Cardiovascular Exercise Tolerance: Good Rhythm:regular Rate:Normal     Neuro/Psych negative psych ROS   GI/Hepatic   Endo/Other    Renal/GU      Musculoskeletal   Abdominal   Peds  Hematology   Anesthesia Other Findings RLS  Reproductive/Obstetrics                           Anesthesia Physical Anesthesia Plan  ASA: II  Anesthesia Plan: General LMA   Post-op Pain Management:    Induction:   Airway Management Planned:   Additional Equipment:   Intra-op Plan:   Post-operative Plan:   Informed Consent: I have reviewed the patients History and Physical, chart, labs and discussed the procedure including the risks, benefits and alternatives for the proposed anesthesia with the patient or authorized representative who has indicated his/her understanding and acceptance.   Dental Advisory Given  Plan Discussed with: CRNA, Surgeon and Anesthesiologist  Anesthesia Plan Comments:         Anesthesia Quick Evaluation

## 2013-05-13 NOTE — Op Note (Signed)
Preoperative diagnosis: Abnormal uterine bleeding, leiomyoma  Postoperative diagnosis: Same  Procedure: Hysteroscopy with resection of submucous fibroids using true clear  Surgeon: Matthew Saras  Anesthesia: Gen.  EBL: Less than 50 cc  Specimens removed: Resected submucous fibroid fragments, to pathology  Procedure and findings:  The patient taken the operating room after an adequate level of general anesthesia was obtained with the legs in stirrups the perineum and vagina were prepped and draped in usual fashion for D&C. The bladder was drained, EUA was carried out uterus upper limit normal size, adnexa negative. Appropriate timeout taken at that point. Speculum was positioned paracervical block was then created by infiltrating at 3 and 9:00 submucosally, 5-7 cc 1% Xylocaine at each site. Uterus sounded to 9 cm progressively dilated to a 27-29 Pratt dilator, the smaller continuous flow hysteroscope was inserted as noted on her sonohysterogram, there was a to 10 a half centimeter submucous fibroid emanating from the right lower lateral wall, a smaller intramural fibroid partially submucous left lateral toward the internal os and another that was noted in the left cornual area. Scope was removed she was then dilated further to allow passage of a larger resector, to clear resector was then used to resect the submucous fibroid down to the surrounding tissue. This is hemostatic. This was mostly submucous, the left cornual fibroid was trimmed down to the surrounding contour and was thus partially resected as was the left lower partial submucous fibroid. These areas were hemostatic cavity appeared to be normal at the end of procedure, its was removed she tolerated this well went to recovery room in good condition. She did receive Toradol IV in the operating room.  Dictated with [facility]  Sherry Mclean.D.

## 2013-05-13 NOTE — Anesthesia Postprocedure Evaluation (Signed)
  Anesthesia Post Note  Patient: Sherry Mclean  Procedure(s) Performed: Procedure(s) (LRB): DILATATION & CURETTAGE/HYSTEROSCOPY WITH TRUCLEAR (N/A)  Anesthesia type: GA  Patient location: PACU  Post pain: Pain level controlled  Post assessment: Post-op Vital signs reviewed  Last Vitals:  Filed Vitals:   05/13/13 0951  BP: 136/91  Pulse: 80  Temp: 36.9 C  Resp: 12    Post vital signs: Reviewed  Level of consciousness: sedated  Complications: No apparent anesthesia complications

## 2013-05-16 ENCOUNTER — Encounter (HOSPITAL_COMMUNITY): Payer: Self-pay | Admitting: Obstetrics and Gynecology

## 2014-01-24 ENCOUNTER — Ambulatory Visit (INDEPENDENT_AMBULATORY_CARE_PROVIDER_SITE_OTHER): Payer: BC Managed Care – PPO | Admitting: Neurology

## 2014-01-24 ENCOUNTER — Encounter: Payer: Self-pay | Admitting: Neurology

## 2014-01-24 VITALS — BP 131/85 | HR 67 | Ht 64.0 in | Wt 118.2 lb

## 2014-01-24 DIAGNOSIS — G4482 Headache associated with sexual activity: Secondary | ICD-10-CM

## 2014-01-24 MED ORDER — INDOMETHACIN 50 MG PO CAPS: 50.0000 mg | ORAL_CAPSULE | Freq: Two times a day (BID) | ORAL | Status: DC

## 2014-01-24 NOTE — Progress Notes (Signed)
Reason for visit: Headache  Sherry Mclean is a 43 y.o. female  History of present illness:  Sherry Mclean is a 43 year old right-handed white female with a history of headaches that began approximately 2 months ago. The patient had a single headache associated with sexual intercourse that occurred in September 2015, lasting about 30 minutes. The headache was in the upper cervical area and occipital region area. The patient did well until 01/14/2014. The patient has had virtually daily headaches since that time associated with sexual intercourse, with the headaches generally occurring at the time of orgasm. The patient may have some left-sided features to the headache, but the headaches are usually posterior in nature. The patient may have headaches lasting up to 4 or 5 hours unassociated with nausea or vomiting. The patient denies any visual disturbance, or numbness or weakness of the extremities. She has not had any blackouts. The patient was in [place] when these headaches began, and she went to the emergency room and underwent a CT scan of the brain that was unremarkable, and a lumbar puncture was done, which was unremarkable. The patient comes to this office for further evaluation of the headache.  Past Medical History  Diagnosis Date  . Restless leg   . Vaginal delivery 2002, 2004  . Headache   . Coital headache 01/24/2014    Past Surgical History  Procedure Laterality Date  . Hernia repair    . Breast enhancement surgery  2013  . Dilatation & curettage/hysteroscopy with trueclear N/A 05/13/2013    Procedure: DILATATION & CURETTAGE/HYSTEROSCOPY WITH TRUCLEAR;  Surgeon: Margarette Asal, MD;  Location: [facility];  Service: Gynecology;  Laterality: N/A;    Family History  Problem Relation Age of Onset  . Cancer Father     colon    Social history:  reports that she has never smoked. She has never used smokeless tobacco. She reports that she drinks alcohol. She reports that she  does not use illicit drugs.  Medications:  Current Outpatient Prescriptions on File Prior to Visit  Medication Sig Dispense Refill  . norethindrone-ethinyl estradiol (JUNEL FE,GILDESS FE,LOESTRIN FE) 1-20 MG-MCG tablet Take 1 tablet by mouth daily.     No current facility-administered medications on file prior to visit.      Allergies  Allergen Reactions  . Pristiq [Desvenlafaxine Succinate Monohydrate] Other (See Comments)    Pt states that her throat felt like it was swollen or "closing up"  . Augmentin [Amoxicillin-Pot Clavulanate] Rash    swelling redness systemic  . Betadine [Povidone Iodine] Rash  . Penicillins Rash    "all cillins" systemic swelling redness    ROS:  Out of a complete 14 system review of symptoms, the patient complains only of the following symptoms, and all other reviewed systems are negative.  Headache  Blood pressure 131/85, pulse 67, height 5\' 4"  (1.626 m), weight 118 lb 3.2 oz (53.615 kg).  Physical Exam  General: The patient is alert and cooperative at the time of the examination.  Eyes: Pupils are equal, round, and reactive to light. Discs are flat bilaterally.  Neck: The neck is supple, no carotid bruits are noted.  Respiratory: The respiratory examination is clear.  Cardiovascular: The cardiovascular examination reveals a regular rate and rhythm, no obvious murmurs or rubs are noted.  Skin: Extremities are without significant edema.  Neurologic Exam  Mental status: The patient is alert and oriented x 3 at the time of the examination. The patient has apparent normal recent  and remote memory, with an apparently normal attention span and concentration ability.  Cranial nerves: Facial symmetry is present. There is good sensation of the face to pinprick and soft touch bilaterally. The strength of the facial muscles and the muscles to head turning and shoulder shrug are normal bilaterally. Speech is well enunciated, no aphasia or dysarthria is  noted. Extraocular movements are full. Visual fields are full. The tongue is midline, and the patient has symmetric elevation of the soft palate. No obvious hearing deficits are noted.  Motor: The motor testing reveals 5 over 5 strength of all 4 extremities. Good symmetric motor tone is noted throughout.  Sensory: Sensory testing is intact to pinprick, soft touch, vibration sensation, and position sense on all 4 extremities. No evidence of extinction is noted.  Coordination: Cerebellar testing reveals good finger-nose-finger and heel-to-shin bilaterally.  Gait and station: Gait is normal. Tandem gait is normal. Romberg is negative. No drift is seen.  Reflexes: Deep tendon reflexes are symmetric and normal bilaterally. Toes are downgoing bilaterally.   Assessment/Plan:  1. Coital headache  The patient is having significant issues with headaches at this time. She will be placed on indomethacin taking 50 mg twice daily for the next month. Fortunately, these headaches tend to be self-limiting, usually lasting 6-8 weeks, and then disappearing. The patient will be sent for MRI evaluation of the brain, and MRA of the head. She will follow-up through this office if needed.  Jill Alexanders MD 01/24/2014 6:59 PM  [facility] [address] [place], [place] [address]  Phone [phone] Fax [phone]

## 2014-01-24 NOTE — Patient Instructions (Signed)

## 2014-01-25 ENCOUNTER — Ambulatory Visit (INDEPENDENT_AMBULATORY_CARE_PROVIDER_SITE_OTHER): Payer: BC Managed Care – PPO

## 2014-01-25 DIAGNOSIS — G4482 Headache associated with sexual activity: Secondary | ICD-10-CM

## 2014-01-31 ENCOUNTER — Telehealth: Payer: Self-pay | Admitting: Neurology

## 2014-01-31 NOTE — Telephone Encounter (Signed)
I called the patient. I discussed the MRI results with her. I will recheck the MRI in 1 to 2 years.   MRA head 02/01/14:  IMPRESSION:  Normal intracranial arteries in this MRA head (without). See MRI brain results from same day regarding small cyst (76mm) near the foramen of monroe on the right side.   MRI brain 02/01/14:  IMPRESSION:  Abnormal MRI brain (without) demonstrating: 1. Small cyst near the foramen of monroe on the right side (22mm) with possible communication with third ventricle but not clear. Signal characteristics follow CSF profile. No blood products or proteinaceous content. Could represent colloid cyst or other neuroglial / epidermal cyst. Consider serial follow up study with and without contrast. 2. No associated hydrocephalus. No acute findings.

## 2014-02-27 ENCOUNTER — Encounter: Payer: Self-pay | Admitting: Neurology

## 2014-03-26 ENCOUNTER — Other Ambulatory Visit: Payer: Self-pay | Admitting: Neurology

## 2015-10-30 NOTE — H&P (Signed)
Sherry Mclean  DICTATION # [id number] CSN# [id number]   Margarette Asal, MD 10/30/2015 2:38 PM

## 2015-10-31 NOTE — H&P (Signed)
Sherry Mclean, Sherry Mclean NO.:  [id number]  MEDICAL RECORD NO.:  [id number]  LOCATION:                                 FACILITY:  PHYSICIAN:  Ralene Bathe. Matthew Saras, M.D.DATE OF BIRTH:  [birth year]  DATE OF ADMISSION: DATE OF DISCHARGE:                             HISTORY & PHYSICAL   CHIEF COMPLAINT:  Symptomatic leiomyoma and abnormal uterine bleeding.  HISTORY OF PRESENT ILLNESS:  A 45 year old G2, P2, currently on OCPs. She has a history of a prior D and C, hysteroscopy for resection of a submucous fibroid.  Despite being on OCPs, continues to have problematic bleeding.  Most recent ultrasound dated July 17 showed multiple fibroids 2.4 x 1.5, 1.4 x 1, 1.2 x 1, several smaller.  On saline infusion, there did appear to be a submucous fibroid that had recurred, approximately 2- 3 cm.  She presents now for definitive LAVH, bilateral salpingectomy. The procedure including specific risks related to bleeding, infection, transfusion, adjacent organ injury, wound infection, phlebitis, possible need to complete the surgery by open technique were reviewed with her which she understands and accepts.  PAST MEDICAL HISTORY:  ALLERGIES:  Penicillin, Augmentin, Betadine.  CURRENT MEDICATIONS:  Lo Loestrin.  REVIEW OF SYSTEMS:  Significant for peptic ulcer disease/hiatal hernia.  SURGICAL HISTORY:  Hernia repair, breast augmentation, vaginal delivery and 1 D and C, hysteroscopy.  SOCIAL HISTORY:  Denies tobacco or drug use.  She does drink alcohol socially.  She is married.  FAMILY HISTORY:  Significant for colon cancer in her father.  PHYSICAL EXAMINATION:  VITAL SIGNS:  Temp 98.2, blood pressure 120/68. HEENT unremarkable. NECK:  Supple without masses. LUNGS:  Clear. CARDIOVASCULAR:  Regular rate and rhythm without murmurs, rubs, gallops. BREASTS:  Reveal implants, no masses. ABDOMEN:  Soft, flat, nontender. GENITOURINARY:  Vulva, vagina, and cervix normal.  Uterus,  10 week size, mobile.  Adnexa negative. EXTREMITIES:  Unremarkable. NEUROLOGIC:  Unremarkable.  IMPRESSION:  Abnormal uterine bleeding, symptomatic leiomyoma.  PLAN:  LAVH, bilateral salpingectomy.  Procedure and risks reviewed as above.     Sherry Mclean M. Matthew Saras, M.D.   ______________________________ Ralene Bathe. Matthew Saras, M.D.    RMH/MEDQ  D:  10/30/2015  T:  10/31/2015  Job:  [id number]

## 2015-11-15 ENCOUNTER — Encounter (HOSPITAL_BASED_OUTPATIENT_CLINIC_OR_DEPARTMENT_OTHER): Payer: Self-pay | Admitting: *Deleted

## 2015-11-15 NOTE — Progress Notes (Signed)
NPO AFTER MN.  ARRIVE AT 0600.  NEEDS URINE PREG. AND T&S.  GETTING OTHER LAB WORK DONE Friday 11-16-2015 (CBC, BMET).

## 2015-11-16 DIAGNOSIS — N838 Other noninflammatory disorders of ovary, fallopian tube and broad ligament: Secondary | ICD-10-CM | POA: Diagnosis not present

## 2015-11-16 DIAGNOSIS — N920 Excessive and frequent menstruation with regular cycle: Secondary | ICD-10-CM | POA: Diagnosis present

## 2015-11-16 DIAGNOSIS — N870 Mild cervical dysplasia: Secondary | ICD-10-CM | POA: Diagnosis not present

## 2015-11-16 LAB — BASIC METABOLIC PANEL
Anion gap: 6 (ref 5–15)
BUN: 17 mg/dL (ref 6–20)
CHLORIDE: 106 mmol/L (ref 101–111)
CO2: 26 mmol/L (ref 22–32)
Calcium: 9.2 mg/dL (ref 8.9–10.3)
Creatinine, Ser: 0.76 mg/dL (ref 0.44–1.00)
GFR calc Af Amer: >60 mL/min (ref >60)
GFR calc non Af Amer: >60 mL/min (ref >60)
GLUCOSE: 76 mg/dL (ref 65–99)
Potassium: 3.8 mmol/L (ref 3.5–5.1)
Sodium: 138 mmol/L (ref 135–145)

## 2015-11-16 LAB — CBC
HCT: 39.8 % (ref 36.0–46.0)
HEMOGLOBIN: 13.7 g/dL (ref 12.0–15.0)
MCH: 35.6 pg — AB (ref 26.0–34.0)
MCHC: 34.4 g/dL (ref 30.0–36.0)
MCV: 103.4 fL — AB (ref 78.0–100.0)
Platelets: 227 10*3/uL (ref 150–400)
RBC: 3.85 MIL/uL — AB (ref 3.87–5.11)
RDW: 12.4 % (ref 11.5–15.5)
WBC: 7.3 10*3/uL (ref 4.0–10.5)

## 2015-11-18 NOTE — Anesthesia Preprocedure Evaluation (Addendum)
Anesthesia Evaluation  Patient identified by MRN, date of birth, ID band Patient awake    Reviewed: Allergy & Precautions, H&P , Patient's Chart, lab work & pertinent test results, reviewed documented beta blocker date and time   History of Anesthesia Complications Negative for: history of anesthetic complications  Airway Mallampati: II  TM Distance: >3 FB Neck ROM: full    Dental  (+) Teeth Intact, Dental Advisory Given   Pulmonary    breath sounds clear to auscultation       Cardiovascular Exercise Tolerance: Good  Rhythm:regular Rate:Normal     Neuro/Psych negative psych ROS   GI/Hepatic   Endo/Other    Renal/GU      Musculoskeletal   Abdominal   Peds  Hematology   Anesthesia Other Findings RLS  Reproductive/Obstetrics                            Anesthesia Physical  Anesthesia Plan  ASA: II  Anesthesia Plan: General   Post-op Pain Management:    Induction: Intravenous  Airway Management Planned: Oral ETT  Additional Equipment:   Intra-op Plan:   Post-operative Plan: Extubation in OR  Informed Consent: I have reviewed the patients History and Physical, chart, labs and discussed the procedure including the risks, benefits and alternatives for the proposed anesthesia with the patient or authorized representative who has indicated his/her understanding and acceptance.   Dental advisory given  Plan Discussed with: CRNA  Anesthesia Plan Comments:         Anesthesia Quick Evaluation

## 2015-11-19 ENCOUNTER — Ambulatory Visit (HOSPITAL_BASED_OUTPATIENT_CLINIC_OR_DEPARTMENT_OTHER): Payer: BLUE CROSS/BLUE SHIELD | Admitting: Anesthesiology

## 2015-11-19 ENCOUNTER — Encounter (HOSPITAL_COMMUNITY)
Admission: RE | Disposition: A | Discharge status: Home / Self Care | Payer: Self-pay | Source: Ambulatory Visit | Attending: Obstetrics and Gynecology

## 2015-11-19 ENCOUNTER — Encounter (HOSPITAL_BASED_OUTPATIENT_CLINIC_OR_DEPARTMENT_OTHER): Payer: Self-pay | Admitting: *Deleted

## 2015-11-19 ENCOUNTER — Ambulatory Visit (HOSPITAL_BASED_OUTPATIENT_CLINIC_OR_DEPARTMENT_OTHER)
Admission: RE | Admit: 2015-11-19 | Discharge: 2015-11-20 | Disposition: A | Discharge status: Home / Self Care | Payer: BLUE CROSS/BLUE SHIELD | Source: Ambulatory Visit | Attending: Obstetrics and Gynecology | Admitting: Obstetrics and Gynecology

## 2015-11-19 DIAGNOSIS — N870 Mild cervical dysplasia: Secondary | ICD-10-CM | POA: Insufficient documentation

## 2015-11-19 DIAGNOSIS — N920 Excessive and frequent menstruation with regular cycle: Secondary | ICD-10-CM | POA: Insufficient documentation

## 2015-11-19 DIAGNOSIS — D219 Benign neoplasm of connective and other soft tissue, unspecified: Secondary | ICD-10-CM | POA: Diagnosis present

## 2015-11-19 DIAGNOSIS — N838 Other noninflammatory disorders of ovary, fallopian tube and broad ligament: Secondary | ICD-10-CM | POA: Insufficient documentation

## 2015-11-19 HISTORY — DX: Leiomyoma of uterus, unspecified: D25.9

## 2015-11-19 HISTORY — DX: Presence of spectacles and contact lenses: Z97.3

## 2015-11-19 HISTORY — DX: Abnormal uterine and vaginal bleeding, unspecified: N93.9

## 2015-11-19 HISTORY — PX: LAPAROSCOPIC VAGINAL HYSTERECTOMY WITH SALPINGECTOMY: SHX6680

## 2015-11-19 LAB — TYPE AND SCREEN
ABO/RH(D): A NEG
Antibody Screen: NEGATIVE

## 2015-11-19 LAB — ABO/RH: ABO/RH(D): A NEG

## 2015-11-19 SURGERY — HYSTERECTOMY, VAGINAL, LAPAROSCOPY-ASSISTED, WITH SALPINGECTOMY
Anesthesia: General | Site: Abdomen | Laterality: Bilateral

## 2015-11-19 MED ORDER — SODIUM CHLORIDE 0.9% FLUSH: 9.0000 mL | INTRAVENOUS | Status: DC | PRN

## 2015-11-19 MED ORDER — MORPHINE SULFATE 2 MG/ML IV SOLN: INTRAVENOUS | Status: DC

## 2015-11-19 MED ORDER — DEXTROSE IN LACTATED RINGERS 5 % IV SOLN: INTRAVENOUS | Status: DC

## 2015-11-19 MED ORDER — OXYCODONE-ACETAMINOPHEN 5-325 MG PO TABS: 1.0000 | ORAL_TABLET | ORAL | Status: DC | PRN

## 2015-11-19 MED ORDER — DOCUSATE SODIUM 100 MG PO CAPS
100.0000 mg | ORAL_CAPSULE | Freq: Two times a day (BID) | ORAL | Status: DC | PRN
  Administered 2015-11-19: 100 mg via ORAL
  Filled 2015-11-19: qty 1

## 2015-11-19 MED ORDER — LIDOCAINE HCL (CARDIAC) 20 MG/ML IV SOLN
INTRAVENOUS | Status: DC | PRN
  Administered 2015-11-19: 100 mg via INTRAVENOUS

## 2015-11-19 MED ORDER — ONDANSETRON HCL 4 MG/2ML IJ SOLN: 4.0000 mg | Freq: Four times a day (QID) | INTRAMUSCULAR | Status: DC | PRN

## 2015-11-19 MED ORDER — MENTHOL 3 MG MT LOZG: 1.0000 | LOZENGE | OROMUCOSAL | Status: DC | PRN

## 2015-11-19 MED ORDER — NALOXONE HCL 0.4 MG/ML IJ SOLN: 0.4000 mg | INTRAMUSCULAR | Status: DC | PRN

## 2015-11-19 MED ORDER — KETOROLAC TROMETHAMINE 30 MG/ML IJ SOLN
INTRAMUSCULAR | Status: AC
  Filled 2015-11-19: qty 1

## 2015-11-19 MED ORDER — IBUPROFEN 400 MG PO TABS: 800.0000 mg | ORAL_TABLET | Freq: Three times a day (TID) | ORAL | Status: DC | PRN

## 2015-11-19 MED ORDER — KETOROLAC TROMETHAMINE 30 MG/ML IJ SOLN: 30.0000 mg | Freq: Four times a day (QID) | INTRAMUSCULAR | Status: DC

## 2015-11-19 MED ORDER — BUPIVACAINE HCL (PF) 0.25 % IJ SOLN
INTRAMUSCULAR | Status: DC | PRN
  Administered 2015-11-19: 3 mL

## 2015-11-19 MED ORDER — DEXAMETHASONE SODIUM PHOSPHATE 10 MG/ML IJ SOLN
INTRAMUSCULAR | Status: AC
  Filled 2015-11-19: qty 1

## 2015-11-19 MED ORDER — DIPHENHYDRAMINE HCL 12.5 MG/5ML PO ELIX: 12.5000 mg | ORAL_SOLUTION | Freq: Four times a day (QID) | ORAL | Status: DC | PRN

## 2015-11-19 MED ORDER — GLYCOPYRROLATE 0.2 MG/ML IV SOSY
PREFILLED_SYRINGE | INTRAVENOUS | Status: AC
  Filled 2015-11-19: qty 3

## 2015-11-19 MED ORDER — PROMETHAZINE HCL 25 MG/ML IJ SOLN
6.2500 mg | INTRAMUSCULAR | Status: DC | PRN
Start: 2015-11-19 — End: 2015-11-19
  Filled 2015-11-19: qty 1

## 2015-11-19 MED ORDER — KETOROLAC TROMETHAMINE 30 MG/ML IJ SOLN
30.0000 mg | Freq: Once | INTRAMUSCULAR | Status: AC
  Administered 2015-11-19: 30 mg via INTRAVENOUS
  Filled 2015-11-19: qty 1

## 2015-11-19 MED ORDER — HYDROMORPHONE HCL 1 MG/ML IJ SOLN
INTRAMUSCULAR | Status: AC
  Filled 2015-11-19: qty 1

## 2015-11-19 MED ORDER — DIPHENHYDRAMINE HCL 50 MG/ML IJ SOLN: 12.5000 mg | Freq: Four times a day (QID) | INTRAMUSCULAR | Status: DC | PRN

## 2015-11-19 MED ORDER — MIDAZOLAM HCL 2 MG/2ML IJ SOLN
INTRAMUSCULAR | Status: AC
  Filled 2015-11-19: qty 2

## 2015-11-19 MED ORDER — PROPOFOL 10 MG/ML IV BOLUS
INTRAVENOUS | Status: DC | PRN
  Administered 2015-11-19: 140 mg via INTRAVENOUS

## 2015-11-19 MED ORDER — GENTAMICIN SULFATE 40 MG/ML IJ SOLN
INTRAVENOUS | Status: AC
  Administered 2015-11-19: 270 mL via INTRAVENOUS
  Filled 2015-11-19 (×2): qty 6.75

## 2015-11-19 MED ORDER — MORPHINE SULFATE 2 MG/ML IV SOLN
INTRAVENOUS | Status: DC
  Administered 2015-11-19: 4 mg via INTRAVENOUS
  Administered 2015-11-19: 12:00:00 via INTRAVENOUS
  Filled 2015-11-19: qty 25

## 2015-11-19 MED ORDER — OXYCODONE HCL 5 MG PO TABS
5.0000 mg | ORAL_TABLET | Freq: Once | ORAL | Status: DC | PRN
  Filled 2015-11-19: qty 1

## 2015-11-19 MED ORDER — LACTATED RINGERS IV SOLN
INTRAVENOUS | Status: DC | PRN
  Administered 2015-11-19: 3000 mL via INTRAVENOUS

## 2015-11-19 MED ORDER — HYDROMORPHONE HCL 1 MG/ML IJ SOLN
0.2500 mg | INTRAMUSCULAR | Status: DC | PRN
  Administered 2015-11-19 (×2): 0.5 mg via INTRAVENOUS
  Filled 2015-11-19: qty 1

## 2015-11-19 MED ORDER — DEXAMETHASONE SODIUM PHOSPHATE 4 MG/ML IJ SOLN
INTRAMUSCULAR | Status: DC | PRN
  Administered 2015-11-19: 10 mg via INTRAVENOUS

## 2015-11-19 MED ORDER — ONDANSETRON HCL 4 MG/2ML IJ SOLN
INTRAMUSCULAR | Status: DC | PRN
  Administered 2015-11-19: 4 mg via INTRAVENOUS

## 2015-11-19 MED ORDER — KETOROLAC TROMETHAMINE 30 MG/ML IJ SOLN
30.0000 mg | Freq: Once | INTRAMUSCULAR | Status: DC
  Filled 2015-11-19: qty 1

## 2015-11-19 MED ORDER — HEMOSTATIC AGENTS (NO CHARGE) OPTIME
TOPICAL | Status: DC | PRN
  Administered 2015-11-19: 1 via TOPICAL

## 2015-11-19 MED ORDER — ROCURONIUM BROMIDE 100 MG/10ML IV SOLN
INTRAVENOUS | Status: DC | PRN
  Administered 2015-11-19: 50 mg via INTRAVENOUS

## 2015-11-19 MED ORDER — BUTORPHANOL TARTRATE 1 MG/ML IJ SOLN: 1.0000 mg | INTRAMUSCULAR | Status: DC | PRN

## 2015-11-19 MED ORDER — GLYCOPYRROLATE 0.2 MG/ML IJ SOLN
INTRAMUSCULAR | Status: DC | PRN
  Administered 2015-11-19: 0.2 mg via INTRAVENOUS

## 2015-11-19 MED ORDER — ONDANSETRON HCL 4 MG PO TABS: 4.0000 mg | ORAL_TABLET | Freq: Four times a day (QID) | ORAL | Status: DC | PRN

## 2015-11-19 MED ORDER — OXYCODONE HCL 5 MG/5ML PO SOLN
5.0000 mg | Freq: Once | ORAL | Status: DC | PRN
  Filled 2015-11-19: qty 5

## 2015-11-19 MED ORDER — PROPOFOL 10 MG/ML IV BOLUS
INTRAVENOUS | Status: AC
  Filled 2015-11-19: qty 40

## 2015-11-19 MED ORDER — ARTIFICIAL TEARS OP OINT
TOPICAL_OINTMENT | OPHTHALMIC | Status: AC
  Filled 2015-11-19: qty 7

## 2015-11-19 MED ORDER — KETOROLAC TROMETHAMINE 30 MG/ML IJ SOLN
INTRAMUSCULAR | Status: DC | PRN
  Administered 2015-11-19: 30 mg via INTRAVENOUS

## 2015-11-19 MED ORDER — LACTATED RINGERS IV SOLN
INTRAVENOUS | Status: DC
  Administered 2015-11-19: 07:00:00 via INTRAVENOUS
  Filled 2015-11-19: qty 1000

## 2015-11-19 MED ORDER — SUGAMMADEX SODIUM 200 MG/2ML IV SOLN
INTRAVENOUS | Status: DC | PRN
  Administered 2015-11-19: 200 mg via INTRAVENOUS

## 2015-11-19 MED ORDER — MIDAZOLAM HCL 5 MG/5ML IJ SOLN
INTRAMUSCULAR | Status: DC | PRN
  Administered 2015-11-19: 2 mg via INTRAVENOUS

## 2015-11-19 MED ORDER — ONDANSETRON HCL 4 MG/2ML IJ SOLN
INTRAMUSCULAR | Status: AC
  Filled 2015-11-19: qty 2

## 2015-11-19 MED ORDER — FENTANYL CITRATE (PF) 100 MCG/2ML IJ SOLN
INTRAMUSCULAR | Status: AC
  Filled 2015-11-19: qty 4

## 2015-11-19 MED ORDER — FENTANYL CITRATE (PF) 100 MCG/2ML IJ SOLN
INTRAMUSCULAR | Status: DC | PRN
  Administered 2015-11-19: 50 ug via INTRAVENOUS
  Administered 2015-11-19: 100 ug via INTRAVENOUS

## 2015-11-19 MED ORDER — KETOROLAC TROMETHAMINE 30 MG/ML IJ SOLN
30.0000 mg | Freq: Four times a day (QID) | INTRAMUSCULAR | Status: DC
  Administered 2015-11-19 – 2015-11-20 (×3): 30 mg via INTRAVENOUS
  Filled 2015-11-19 (×3): qty 1

## 2015-11-19 MED ORDER — MEPERIDINE HCL 25 MG/ML IJ SOLN
6.2500 mg | INTRAMUSCULAR | Status: DC | PRN
Start: 2015-11-19 — End: 2015-11-19
  Filled 2015-11-19: qty 1

## 2015-11-19 SURGICAL SUPPLY — 82 items
APL SRG 38 LTWT LNG FL B (MISCELLANEOUS) ×1
APPLICATOR ARISTA FLEXITIP XL (MISCELLANEOUS) ×2 IMPLANT
BLADE SURG 10 STRL SS (BLADE) IMPLANT
BLADE SURG 11 STRL SS (BLADE) ×3 IMPLANT
BNDG ADH 7/8 SPOT LF (GAUZE/BANDAGES/DRESSINGS) ×2 IMPLANT
CATH ROBINSON RED A/P 16FR (CATHETERS) ×3 IMPLANT
CLEANER CAUTERY TIP 5X5 PAD (MISCELLANEOUS) ×1 IMPLANT
CLOSURE WOUND 1/4 X3 (GAUZE/BANDAGES/DRESSINGS)
COVER BACK TABLE 60X90IN (DRAPES) ×6 IMPLANT
COVER MAYO STAND STRL (DRAPES) ×6 IMPLANT
DRAPE LG THREE QUARTER DISP (DRAPES) ×3 IMPLANT
DRAPE UNDERBUTTOCKS STRL (DRAPE) ×3 IMPLANT
DRSG OPSITE POSTOP 3X4 (GAUZE/BANDAGES/DRESSINGS) IMPLANT
DRSG TEGADERM 2-3/8X2-3/4 SM (GAUZE/BANDAGES/DRESSINGS) ×3 IMPLANT
ELECT LIGASURE LONG (ELECTRODE) IMPLANT
ELECT LIGASURE SHORT 9 REUSE (ELECTRODE) ×5 IMPLANT
ELECT REM PT RETURN 9FT ADLT (ELECTROSURGICAL) ×3
ELECTRODE REM PT RTRN 9FT ADLT (ELECTROSURGICAL) ×1 IMPLANT
GLOVE BIO SURGEON STRL SZ 6.5 (GLOVE) ×3 IMPLANT
GLOVE BIO SURGEON STRL SZ7 (GLOVE) ×11 IMPLANT
GLOVE BIO SURGEONS STRL SZ 6.5 (GLOVE) ×2
GLOVE BIOGEL PI IND STRL 6.5 (GLOVE) IMPLANT
GLOVE BIOGEL PI IND STRL 7.0 (GLOVE) IMPLANT
GLOVE BIOGEL PI IND STRL 7.5 (GLOVE) IMPLANT
GLOVE BIOGEL PI INDICATOR 6.5 (GLOVE) ×2
GLOVE BIOGEL PI INDICATOR 7.0 (GLOVE) ×2
GLOVE BIOGEL PI INDICATOR 7.5 (GLOVE) ×6
GOWN STRL REUS W/ TWL LRG LVL3 (GOWN DISPOSABLE) ×4 IMPLANT
GOWN STRL REUS W/TWL LRG LVL3 (GOWN DISPOSABLE) ×9
HEMOSTAT ARISTA ABSORB 3G PWDR (MISCELLANEOUS) ×2 IMPLANT
HOLDER FOLEY CATH W/STRAP (MISCELLANEOUS) ×3 IMPLANT
KIT ROOM TURNOVER WOR (KITS) ×3 IMPLANT
LIQUID BAND (GAUZE/BANDAGES/DRESSINGS) ×3 IMPLANT
MANIFOLD NEPTUNE II (INSTRUMENTS) IMPLANT
NDL HYPO 25X1 1.5 SAFETY (NEEDLE) ×1 IMPLANT
NDL INSUFFLATION 14GA 120MM (NEEDLE) ×1 IMPLANT
NDL SPNL 22GX3.5 QUINCKE BK (NEEDLE) IMPLANT
NEEDLE HYPO 25X1 1.5 SAFETY (NEEDLE) ×3 IMPLANT
NEEDLE INSUFFLATION 14GA 120MM (NEEDLE) ×3 IMPLANT
NEEDLE SPNL 22GX3.5 QUINCKE BK (NEEDLE) IMPLANT
NS IRRIG 1000ML POUR BTL (IV SOLUTION) ×2 IMPLANT
NS IRRIG 500ML POUR BTL (IV SOLUTION) ×1 IMPLANT
PACK BASIN DAY SURGERY FS (CUSTOM PROCEDURE TRAY) ×3 IMPLANT
PACKING VAGINAL (PACKING) IMPLANT
PAD CLEANER CAUTERY TIP 5X5 (MISCELLANEOUS) ×2
PAD OB MATERNITY 4.3X12.25 (PERSONAL CARE ITEMS) ×3 IMPLANT
PADDING ION DISPOSABLE (MISCELLANEOUS) ×3 IMPLANT
PENCIL BUTTON HOLSTER BLD 10FT (ELECTRODE) ×3 IMPLANT
SEALER TISSUE G2 CVD JAW 45CM (ENDOMECHANICALS) ×5 IMPLANT
SET IRRIG TUBING LAPAROSCOPIC (IRRIGATION / IRRIGATOR) IMPLANT
SHEET LAVH (DRAPES) ×3 IMPLANT
SOLUTION ANTI FOG 6CC (MISCELLANEOUS) ×3 IMPLANT
SOLUTION ELECTROLUBE (MISCELLANEOUS) IMPLANT
SPONGE GAUZE 2X2 8PLY STER LF (GAUZE/BANDAGES/DRESSINGS) ×1
SPONGE GAUZE 2X2 8PLY STRL LF (GAUZE/BANDAGES/DRESSINGS) ×2 IMPLANT
SPONGE GAUZE 4X4 12PLY STER LF (GAUZE/BANDAGES/DRESSINGS) IMPLANT
SPONGE LAP 4X18 X RAY DECT (DISPOSABLE) ×3 IMPLANT
STRIP CLOSURE SKIN 1/4X3 (GAUZE/BANDAGES/DRESSINGS) IMPLANT
SUT MNCRL AB 4-0 PS2 18 (SUTURE) ×3 IMPLANT
SUT MON AB 2-0 CT1 36 (SUTURE) IMPLANT
SUT VIC AB 0 CT1 18XCR BRD8 (SUTURE) ×1 IMPLANT
SUT VIC AB 0 CT1 36 (SUTURE) ×3 IMPLANT
SUT VIC AB 0 CT1 8-18 (SUTURE) ×6
SUT VIC AB 2-0 CT1 (SUTURE) ×3 IMPLANT
SUT VIC AB 2-0 UR6 27 (SUTURE) IMPLANT
SUT VICRYL 0 TIES 12 18 (SUTURE) ×3 IMPLANT
SUT VICRYL 0 UR6 27IN ABS (SUTURE) IMPLANT
SUT VICRYL 4-0 PS2 18IN ABS (SUTURE) ×2 IMPLANT
SYR BULB IRRIGATION 50ML (SYRINGE) ×3 IMPLANT
SYR CONTROL 10ML LL (SYRINGE) ×3 IMPLANT
SYRINGE 10CC LL (SYRINGE) ×6 IMPLANT
TOWEL OR 17X24 6PK STRL BLUE (TOWEL DISPOSABLE) ×6 IMPLANT
TRAY DSU PREP LF (CUSTOM PROCEDURE TRAY) ×3 IMPLANT
TRAY FOLEY CATH SILVER 14FR (SET/KITS/TRAYS/PACK) ×3 IMPLANT
TROCAR OPTI TIP 5M 100M (ENDOMECHANICALS) ×3 IMPLANT
TROCAR XCEL DIL TIP R 11M (ENDOMECHANICALS) ×3 IMPLANT
TUBE CONNECTING 12'X1/4 (SUCTIONS) ×2
TUBE CONNECTING 12X1/4 (SUCTIONS) ×4 IMPLANT
TUBING INSUFFLATION 10FT LAP (TUBING) ×3 IMPLANT
WARMER LAPAROSCOPE (MISCELLANEOUS) ×3 IMPLANT
WATER STERILE IRR 500ML POUR (IV SOLUTION) ×1 IMPLANT
YANKAUER SUCT BULB TIP NO VENT (SUCTIONS) ×3 IMPLANT

## 2015-11-19 NOTE — Anesthesia Postprocedure Evaluation (Signed)
Anesthesia Post Note  Patient: Sherry Mclean  Procedure(s) Performed: Procedure(s) (LRB): LAPAROSCOPIC ASSISTED VAGINAL HYSTERECTOMY WITH SALPINGECTOMY (Bilateral)  Patient location during evaluation: PACU Anesthesia Type: General Level of consciousness: sedated and patient cooperative Pain management: pain level controlled Vital Signs Assessment: post-procedure vital signs reviewed and stable Respiratory status: spontaneous breathing Cardiovascular status: stable Anesthetic complications: no    Last Vitals:  Vitals:   11/19/15 0945 11/19/15 1000  BP: 122/79 110/66  Pulse: 72 65  Resp: (!) 26 (!) 23  Temp:      Last Pain:  Vitals:   11/19/15 1000  TempSrc:   PainSc: [place]

## 2015-11-19 NOTE — Anesthesia Procedure Notes (Signed)
Procedure Name: Intubation Date/Time: 11/19/2015 7:38 AM Performed by: Wanita Chamberlain Pre-anesthesia Checklist: Patient identified, Timeout performed, Emergency Drugs available, Suction available and Patient being monitored Patient Re-evaluated:Patient Re-evaluated prior to inductionOxygen Delivery Method: Circle system utilized Preoxygenation: Pre-oxygenation with 100% oxygen Intubation Type: IV induction Ventilation: Mask ventilation without difficulty Laryngoscope Size: Mac and 3 Grade View: Grade I Tube type: Oral Number of attempts: 1 Airway Equipment and Method: Stylet Placement Confirmation: positive ETCO2 and breath sounds checked- equal and bilateral Secured at: 21 cm Tube secured with: Tape Dental Injury: Teeth and Oropharynx as per pre-operative assessment

## 2015-11-19 NOTE — Transfer of Care (Signed)
Immediate Anesthesia Transfer of Care Note  Patient: Emmelie Chmelik Mikkelsen  Procedure(s) Performed: Procedure(s) with comments: LAPAROSCOPIC ASSISTED VAGINAL HYSTERECTOMY WITH SALPINGECTOMY (Bilateral) - need bed  Patient Location: PACU  Anesthesia Type:General  Level of Consciousness: awake, alert , oriented and patient cooperative  Airway & Oxygen Therapy: Patient Spontanous Breathing and Patient connected to nasal cannula oxygen  Post-op Assessment: Report given to RN and Post -op Vital signs reviewed and stable  Post vital signs: Reviewed and stable  Last Vitals:  Vitals:   11/19/15 0613  BP: 113/68  Pulse: 67  Resp: 16  Temp: 37 C    Last Pain:  Vitals:   11/19/15 0613  TempSrc: Oral      Patients Stated Pain Goal: 4 ([id number])  Complications: No apparent anesthesia complications

## 2015-11-19 NOTE — Progress Notes (Signed)
The patient was re-examined with no change in status 

## 2015-11-19 NOTE — Progress Notes (Signed)
alert and conversant, receiving Toradol only, UOP clear + adeq, abd soft + BS

## 2015-11-19 NOTE — Op Note (Signed)
Preoperative diagnosis: Symptomatic leiomyoma with menorrhagia  Postoperative diagnosis same  Procedure: LAVH, bilateral salpingectomy  Surgeon: Matthew Saras  Asst.: Leger  EBL: 1 50 cc  Procedure and findings:  The patient taken to the operating room after an adequate level of general anesthesia was obtained with the patient's legs in stirrups, the abdomen perineum and vagina were prepped and draped. The bladder was drained. EUA was carried out the uterus was 8 weeks size, mobile, adnexa negative. Appropriate timeouts were taken at that point. Hulka tenaculum was positioned.  Attention directed to the abdomen where the subumbilical area was infiltrated with quarter percent Marcaine plain, small incision was made in the varies needle was introduced that difficulty. Its intra-abdominal position was verified by pressure water testing. After a 3 L pneumoperitoneum syncopated, lap scopic trocar and sleeve were then introduced without difficulty. There was no evidence of any bleeding or trauma. 3 finger breaths above the symphysis in the midline, 5 mm trocar was inserted under direct visualization. Patient was then placed in Trendelenburg and the pelvic findings as follows:  The uterus itself was 8 weeks size irregular with fibroids bilateral adnexa and upper abdominal findings unremarkable. Starting on the right an atraumatic grasper was then used to grasp the fimbriated end of the right tube with the uterus on traction to the opposite side the Enseal device was used to coagulate and divide the mesosalpinx. This was continued through the utero-ovarian pedicle down to and including the round ligament with excellent hemostasis. The exact same repeated on the opposite side thus removing both tubes and conserving both normal ovaries. Once this was completed the vaginal portion the procedure started  Legs were extended, weighted speculum was positioned cervix grasped with a tenaculum, cervical vaginal mucosa was  incised circumferentially bladder was advanced superiorly with sharp and blunt dissection. Posterior culdotomy was performed without difficulty. The anterior peritoneal reflection was identified this was entered sharply, retractor was then used to gently elevate the bladder out of the field. In sequential manner the uterosacral ligament cardinal ligament and uterine vasculature pedicles were coagulated and divided with the LigaSure device. This was continued through the upper broad ligament staying close to the uterus. The fundus of the uterus is in delivered posteriorly, remaining pedicles coagulated and divided. Vaginal cuff was then closed from 3 to 9:00 with a running 2-0 Vicryl suture. McCall's culdoplasty suture was placed from left uterosacral ligament picking up posterior peritoneum across to the right uterosacral ligament which was tied down for extra posterior support. Prior to closure sponge, needle, instrument counts reported as correct 2. Vaginal cuff was then closed right to left with interrupted 2-0 Vicryl sutures. This was hemostatic Foley catheter positioned draining clear urine. Repeat laparoscopy was carried out, the Nezhat was used to irrigate and suction, the operative site was hemostatic there was some minimal oozing at the cuff, the flexible sheath was then used to puff Arista across the cuff area this was observed and noted be hemostatic. Inserts removed, gas allowed to escape, the umbilical incision closed with 4-0 Monocryl subcuticular, Dermabond on the lower she tolerated this well went to recovery room in good condition.  Dictated with [place].

## 2015-11-20 ENCOUNTER — Encounter (HOSPITAL_BASED_OUTPATIENT_CLINIC_OR_DEPARTMENT_OTHER): Payer: Self-pay | Admitting: Obstetrics and Gynecology

## 2015-11-20 DIAGNOSIS — N920 Excessive and frequent menstruation with regular cycle: Secondary | ICD-10-CM | POA: Diagnosis not present

## 2015-11-20 LAB — CBC
HCT: 32.8 % — ABNORMAL LOW (ref 36.0–46.0)
Hemoglobin: 11.5 g/dL — ABNORMAL LOW (ref 12.0–15.0)
MCH: 35.9 pg — AB (ref 26.0–34.0)
MCHC: 35.1 g/dL (ref 30.0–36.0)
MCV: 102.5 fL — AB (ref 78.0–100.0)
PLATELETS: 205 10*3/uL (ref 150–400)
RBC: 3.2 MIL/uL — ABNORMAL LOW (ref 3.87–5.11)
RDW: 12.2 % (ref 11.5–15.5)
WBC: 11.2 10*3/uL — ABNORMAL HIGH (ref 4.0–10.5)

## 2015-11-20 MED ORDER — DOCUSATE SODIUM 100 MG PO CAPS
100.0000 mg | ORAL_CAPSULE | Freq: Two times a day (BID) | ORAL | Status: DC
  Administered 2015-11-20: 100 mg via ORAL
  Filled 2015-11-20: qty 1

## 2015-11-20 MED ORDER — IBUPROFEN 800 MG PO TABS: 800.0000 mg | ORAL_TABLET | Freq: Three times a day (TID) | ORAL | 1 refills | Status: DC | PRN

## 2015-11-20 NOTE — Care Management Note (Signed)
Case Management Note  Patient Details  Name: Sherry Mclean MRN: [id number] Date of Birth: 1970-08-22  Subjective/Objective:                  LAVH, bilateral salpingectomy Action/Plan: Discharge planning Expected Discharge Date:  11/20/15               Expected Discharge Plan:  Home/Self Care  In-House Referral:     Discharge planning Services  CM Consult  Post Acute Care Choice:    Choice offered to:  Patient  DME Arranged:  N/A DME Agency:  NA  HH Arranged:  NA HH Agency:  NA  Status of Service:  Completed, signed off  If discussed at [place] of Stay Meetings, dates discussed:    Additional Comments: CM met with pt in room to discuss home needs. Pt has PCP, insurance and states she has no DME or [place] service needs.  No other CM needs were communicated. Dellie Catholic, RN 11/20/2015, 9:59 AM

## 2015-11-20 NOTE — Progress Notes (Signed)
Pt tolerated regular diet. No nausea or vomiting. Pt ambulated in hall at 2100.Tolerated well. IVF discontinued. Pain rated 3 out of 10. Morphine PCA discontinued per MD order when tolerating regular diet. 40mg  of morphine PCA wasted in sink at 0115. Witnessed by Archie Balboa RN. Will continue to monitor.

## 2015-11-20 NOTE — Discharge Summary (Signed)
Physician Discharge Summary  Patient ID: Sherry Mclean MRN: [id number] DOB/AGE: May 31, 1970 45 y.o.  Admit date: 11/19/2015 Discharge date: 11/20/2015  Admission Diagnoses:menorrhagia, leiomyoma  Discharge Diagnoses: same Active Problems:   Leiomyoma   Discharged Condition: good  Hospital Course: adm for LAVH + Bilat salping>>D/C on POD 1, afeb, tol PO  Consults: None  Significant Diagnostic Studies:  CBC    Component Value Date/Time   WBC 11.2 (H) 11/20/2015 0445   RBC 3.20 (L) 11/20/2015 0445   HGB 11.5 (L) 11/20/2015 0445   HCT 32.8 (L) 11/20/2015 0445   PLT 205 11/20/2015 0445   MCV 102.5 (H) 11/20/2015 0445   MCH 35.9 (H) 11/20/2015 0445   MCHC 35.1 11/20/2015 0445   RDW 12.2 11/20/2015 0445     Treatments: surgery: LAVH, BS  Discharge Exam: Blood pressure 118/77, pulse 85, temperature 98.9 F (37.2 C), temperature source Oral, resp. rate 18, height 5\' 4"  (1.626 m), weight 118 lb 8 oz (53.8 kg), last menstrual period 11/15/2015, SpO2 100 %. abd soft + BS, incs C/D  Disposition: 01-Home or Self Care     Medication List    STOP taking these medications   naproxen sodium 220 MG tablet Commonly known as:  ANAPROX   norethindrone-ethinyl estradiol 1-20 MG-MCG tablet Commonly known as:  JUNEL FE,GILDESS FE,LOESTRIN FE     TAKE these medications   BIOTIN PO Take 1 tablet by mouth daily.   cetaphil cream Apply 1 application topically daily as needed (Dry skin).   ibuprofen 800 MG tablet Commonly known as:  ADVIL,MOTRIN Take 1 tablet (800 mg total) by mouth every 8 (eight) hours as needed (mild pain). What changed:  medication strength  how much to take  when to take this  reasons to take this      Follow-up Information    Margarette Asal, MD. Schedule an appointment as soon as possible for a visit in 1 week(s).   Specialty:  Obstetrics and Gynecology Contact information: [address] [place]  [address] [phone]           Signed: Margarette Asal 11/20/2015, 8:46 AM

## 2016-02-01 ENCOUNTER — Telehealth: Payer: Self-pay | Admitting: Neurology

## 2016-02-01 NOTE — Telephone Encounter (Signed)
I called the patient. I left a message concerning a repeat MRI of the brain to evaluate for the third ventricular cyst. Last study was done 2015.  If the patient is amenable to her repeat study, I will get MRI the brain done with and without gadolinium enhancement.

## 2017-11-13 ENCOUNTER — Telehealth: Payer: Self-pay | Admitting: *Deleted

## 2017-11-13 ENCOUNTER — Ambulatory Visit: Payer: 59 | Admitting: Neurology

## 2017-11-13 ENCOUNTER — Encounter: Payer: Self-pay | Admitting: Neurology

## 2017-11-13 ENCOUNTER — Other Ambulatory Visit: Payer: Self-pay

## 2017-11-13 VITALS — BP 102/70 | HR 61 | Ht 64.0 in | Wt 118.0 lb

## 2017-11-13 DIAGNOSIS — G4489 Other headache syndrome: Secondary | ICD-10-CM

## 2017-11-13 DIAGNOSIS — G4482 Headache associated with sexual activity: Secondary | ICD-10-CM

## 2017-11-13 MED ORDER — INDOMETHACIN 50 MG PO CAPS: 50.0000 mg | ORAL_CAPSULE | Freq: Two times a day (BID) | ORAL | 1 refills | Status: DC

## 2017-11-13 NOTE — Telephone Encounter (Signed)
Called pt and offered 1130am today with Dr. Jannifer Franklin. She was on wait list for sooner appt. She accepted appt date/time. She will check in at 11am, bring insurance cards, updated med list and copay for visit.

## 2017-11-13 NOTE — Progress Notes (Signed)
Reason for visit: Coital headache  Referring physician: Dr. Clearance Coots is a 47 y.o. female  History of present illness:  Sherry Mclean is a 47 year old right-handed white female with a history of coital headache that occurred in 2015.  The patient underwent MRI of the brain at that time it was found to have a small 5 mm cyst around the foramen of [place], it did not appear to be obstructing the foramen.  The patient was treated with indomethacin 50 mg twice daily for 4 weeks.  The patient herself does not remember taking the medication or whether or not the medication helped.  The patient has had a recurrence of very similar headaches within the last month.  Once again, the headaches are coming on with sexual intercourse at the time of orgasm.  The headaches may last up to 1 to 2 hours, the onset is quite sudden and is mainly bifrontal and some component of occipital headache.  The patient reports no syncope associated with the events.  She has no nausea vomiting, photophobia or phonophobia.  If she bends over or stoops during the event, she will have an increase in head pressure.  The patient reports no numbness or weakness of the face, arms, legs.  She denies any balance changes or difficulty controlling the bowels or the bladder.  She returns to this office for an evaluation.  Past Medical History:  Diagnosis Date  . Abnormal uterine bleeding (AUB)   . Uterine fibroid   . Wears contact lenses     Past Surgical History:  Procedure Laterality Date  . BREAST ENHANCEMENT SURGERY  2013  . DILATATION & CURETTAGE/HYSTEROSCOPY WITH TRUECLEAR N/A 05/13/2013   Procedure: DILATATION & CURETTAGE/HYSTEROSCOPY WITH TRUCLEAR;  Surgeon: Margarette Asal, MD;  Location: [facility];  Service: Gynecology;  Laterality: N/A;  . INGUINAL HERNIA REPAIR Right 2009  . LAPAROSCOPIC VAGINAL HYSTERECTOMY WITH SALPINGECTOMY Bilateral 11/19/2015   Procedure: LAPAROSCOPIC ASSISTED VAGINAL HYSTERECTOMY  WITH SALPINGECTOMY;  Surgeon: Molli Posey, MD;  Location: [facility];  Service: Gynecology;  Laterality: Bilateral;  need bed    Family History  Problem Relation Age of Onset  . Cancer Father        colon    Social history:  reports that she has never smoked. She has never used smokeless tobacco. She reports that she drinks about 7.0 standard drinks of alcohol per week. She reports that she does not use drugs.  Medications:  Prior to Admission medications   Medication Sig Start Date End Date Taking? Authorizing Provider  BIOTIN PO Take 1 tablet by mouth daily.    Yes [provider]  Emollient (CETAPHIL) cream Apply 1 application topically daily as needed (Dry skin).   Yes [provider]  ibuprofen (ADVIL,MOTRIN) 800 MG tablet Take 1 tablet (800 mg total) by mouth every 8 (eight) hours as needed (mild pain). 11/20/15  Yes Molli Posey, MD  valACYclovir (VALTREX) 500 MG tablet Take 500 mg by mouth. 10/16/17  Yes [provider]  indomethacin (INDOCIN) 50 MG capsule Take 1 capsule (50 mg total) by mouth 2 (two) times daily with a meal. 11/13/17   Kathrynn Ducking, MD      Allergies  Allergen Reactions  . Augmentin [Amoxicillin-Pot Clavulanate] Swelling and Rash    swelling redness systemic  . Penicillins Swelling and Rash    "all cillins" systemic swelling redness Has patient had a PCN reaction causing immediate rash, facial/tongue/throat swelling, SOB or  lightheadedness with hypotension: Yes Has patient had a PCN reaction causing severe rash involving mucus membranes or skin necrosis: No Has patient had a PCN reaction that required hospitalization Yes Has patient had a PCN reaction occurring within the last 10 years: Yes If all of the above answers are "NO", then may proceed with Cephalosporin use.  . Pristiq [Desvenlafaxine Succinate Monohydrate] Other (See Comments)    Pt states that her throat felt like it was swollen or "closing  up"  . Betadine [Povidone Iodine] Rash    ROS:  Out of a complete 14 system review of symptoms, the patient complains only of the following symptoms, and all other reviewed systems are negative.  Headache  Blood pressure 102/70, pulse 61, height 5\' 4"  (1.626 m), weight 118 lb (53.5 kg).  Physical Exam  General: The patient is alert and cooperative at the time of the examination.  Eyes: Pupils are equal, round, and reactive to light. Discs are flat bilaterally.  Neck: The neck is supple, no carotid bruits are noted.  Respiratory: The respiratory examination is clear.  Cardiovascular: The cardiovascular examination reveals a regular rate and rhythm, no obvious murmurs or rubs are noted.  Neuromuscular: Range move the cervical spine is full.  No crepitus is noted in the temporomandibular joints.  Skin: Extremities are without significant edema.  Neurologic Exam  Mental status: The patient is alert and oriented x 3 at the time of the examination. The patient has apparent normal recent and remote memory, with an apparently normal attention span and concentration ability.  Cranial nerves: Facial symmetry is present. There is good sensation of the face to pinprick and soft touch bilaterally. The strength of the facial muscles and the muscles to head turning and shoulder shrug are normal bilaterally. Speech is well enunciated, no aphasia or dysarthria is noted. Extraocular movements are full. Visual fields are full. The tongue is midline, and the patient has symmetric elevation of the soft palate. No obvious hearing deficits are noted.  Motor: The motor testing reveals 5 over 5 strength of all 4 extremities. Good symmetric motor tone is noted throughout.  Sensory: Sensory testing is intact to pinprick, soft touch, vibration sensation, and position sense on all 4 extremities. No evidence of extinction is noted.  Coordination: Cerebellar testing reveals good finger-nose-finger and  heel-to-shin bilaterally.  Gait and station: Gait is normal. Tandem gait is normal. Romberg is negative. No drift is seen.  Reflexes: Deep tendon reflexes are symmetric and normal bilaterally. Toes are downgoing bilaterally.   Assessment/Plan:  1.  Coital headache, recurrent  The patient has a similar headache now as she did in 2015.  We will once again give a trial on indomethacin 50 mg twice daily for 1 month.  A prescription was sent in.  She will call if her headaches continue.  Given the history of the third ventricular cyst, MRI of the brain will be done to ensure that the cyst has not enlarged.   Sherry Alexanders MD 11/13/2017 12:01 PM  [facility] [address] [place], [place] [address]  Phone [phone] Fax [phone]

## 2017-11-13 NOTE — Patient Instructions (Signed)
We will start Indomethacin 50 mg twice a day for 1 month.

## 2017-11-16 ENCOUNTER — Telehealth: Payer: Self-pay | Admitting: Neurology

## 2017-11-16 NOTE — Telephone Encounter (Signed)
lvm for pt to call back about scheduling [facility] Auth: [id number] (exp. 11/16/17 to 12/31/17)

## 2017-12-01 ENCOUNTER — Telehealth: Payer: Self-pay | Admitting: Neurology

## 2017-12-01 NOTE — Telephone Encounter (Signed)
Spoke to the patient she is scheduled for 12/30/17 at [facility]

## 2017-12-01 NOTE — Telephone Encounter (Signed)
Pt returned Emily's call °

## 2017-12-01 NOTE — Telephone Encounter (Signed)
Spoke to the patient she is scheduled for 12/30/17 at [facility].

## 2017-12-30 ENCOUNTER — Ambulatory Visit: Payer: 59

## 2017-12-30 DIAGNOSIS — G4482 Headache associated with sexual activity: Secondary | ICD-10-CM

## 2017-12-30 DIAGNOSIS — G4489 Other headache syndrome: Secondary | ICD-10-CM

## 2017-12-31 ENCOUNTER — Telehealth: Payer: Self-pay | Admitting: Neurology

## 2017-12-31 NOTE — Telephone Encounter (Signed)
I called the patient.  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  It has gotten larger from 2015 but does not appear to be impairing spinal fluid flow from the lateral ventricles.  The patient appears to have coital headaches, she does not get headaches with other activities such as straining or bearing down.  I do not believe that the cyst has anything to do with her headache.  The patient claims that her coital headaches have disappeared.  I would recommend rechecking the MRI of the brain in about 2 years.  The patient will contact me if she does develop headaches associated with straining, bending, or stooping.    MRI brain 12/31/17:  IMPRESSION:   MRI brain (without) demonstrating: - Small ovoid cyst noted near the right foramen of [place], third ventricle, measuring 27mm in diameter in axial views and 59mm in diameter in coronal views.  Minimal surrounding rim of gliosis.  Cyst contents follow CSF characteristics.  This lesion is located just to the right of midline, but extends 1 mm across the midline to the left side.  May represent neuroglial cyst or colloid cyst. - Compared to MRI on 01/25/2014, the cyst has slightly increased in size (previously measuring 13mm in diameter in axial views and 67mm in diameter in coronal views).

## 2018-01-01 ENCOUNTER — Encounter

## 2018-01-01 ENCOUNTER — Ambulatory Visit: Payer: 59 | Admitting: Neurology

## 2018-03-30 ENCOUNTER — Other Ambulatory Visit: Payer: Self-pay | Admitting: Neurology

## 2018-08-30 ENCOUNTER — Other Ambulatory Visit: Payer: Self-pay | Admitting: Neurology

## 2019-06-10 ENCOUNTER — Ambulatory Visit: Payer: BLUE CROSS/BLUE SHIELD | Attending: Internal Medicine

## 2019-06-10 DIAGNOSIS — Z23 Encounter for immunization: Secondary | ICD-10-CM

## 2019-06-10 NOTE — Progress Notes (Signed)
   Covid-19 Vaccination Clinic  Name:  Sherry Mclean    MRN: [id number] DOB: [birth year]  06/10/2019  [name] was observed post Covid-19 immunization for 15 minutes without incident. She was provided with Vaccine Information Sheet and instruction to access the V-Safe system.   [name] was instructed to call 911 with any severe reactions post vaccine: Marland Kitchen Difficulty breathing  . Swelling of face and throat  . A fast heartbeat  . A bad rash all over body  . Dizziness and weakness   Immunizations Administered    Name Date Dose VIS Date Route   Pfizer COVID-19 Vaccine 06/10/2019  2:12 PM 0.3 mL 02/11/2019 Intramuscular   Manufacturer: [place]   Lot: [id number]   [place]: [id number]

## 2019-07-04 ENCOUNTER — Ambulatory Visit (INDEPENDENT_AMBULATORY_CARE_PROVIDER_SITE_OTHER): Payer: BC Managed Care – PPO | Admitting: Family Medicine

## 2019-07-04 ENCOUNTER — Other Ambulatory Visit: Payer: Self-pay

## 2019-07-04 ENCOUNTER — Encounter: Payer: Self-pay | Admitting: Family Medicine

## 2019-07-04 ENCOUNTER — Ambulatory Visit: Payer: BC Managed Care – PPO | Attending: Internal Medicine

## 2019-07-04 DIAGNOSIS — M7740 Metatarsalgia, unspecified foot: Secondary | ICD-10-CM

## 2019-07-04 DIAGNOSIS — Z23 Encounter for immunization: Secondary | ICD-10-CM

## 2019-07-04 NOTE — Patient Instructions (Signed)
You have metatarsalgia. Wear the sports insoles with small metatarsal pads when up and walking around - these are very important for long term relief. You can move the insoles in and out of different shoes also. Walking, activities as tolerated. Tylenol, icing only if needed. Do ankle strengthening exercises for both ankles as well. Follow up in 1 month for reevaluation.

## 2019-07-04 NOTE — Progress Notes (Signed)
   Covid-19 Vaccination Clinic  Name:  Sherry Mclean    MRN: [id number] DOB: October 25, 1970  07/04/2019  [name] was observed post Covid-19 immunization for 15 minutes without incident. She was provided with Vaccine Information Sheet and instruction to access the V-Safe system.   [name] was instructed to call 911 with any severe reactions post vaccine: Marland Kitchen Difficulty breathing  . Swelling of face and throat  . A fast heartbeat  . A bad rash all over body  . Dizziness and weakness   Immunizations Administered    Name Date Dose VIS Date Route   Pfizer COVID-19 Vaccine 07/04/2019  2:47 PM 0.3 mL 04/27/2018 Intramuscular   Manufacturer: [place]   Lot: [id number]   [place]: [id number]

## 2019-07-05 ENCOUNTER — Encounter: Payer: Self-pay | Admitting: Family Medicine

## 2019-07-05 NOTE — Progress Notes (Signed)
PCP: Molli Posey, MD  Subjective:   HPI: Patient is a 49 y.o. female here for left foot pain.  Patient reports she walks about 5 miles a day. Used to be a runner but backed off this and only walking past couple years. Pain in left foot is mostly on plantar surface of forefoot and started 2 months ago. Feels like a bruise in this area. Worse when wearing heels. Tried a thin inserts without much benefit. No swelling or bruising. Worse with ambulation. No prior history of stress fracture.  Past Medical History:  Diagnosis Date  . Abnormal uterine bleeding (AUB)   . Uterine fibroid   . Wears contact lenses     Current Outpatient Medications on File Prior to Visit  Medication Sig Dispense Refill  . BIOTIN PO Take 1 tablet by mouth daily.     . valACYclovir (VALTREX) 500 MG tablet Take 500 mg by mouth.  2  . Emollient (CETAPHIL) cream Apply 1 application topically daily as needed (Dry skin).     No current facility-administered medications on file prior to visit.    Past Surgical History:  Procedure Laterality Date  . BREAST ENHANCEMENT SURGERY  2013  . DILATATION & CURETTAGE/HYSTEROSCOPY WITH TRUECLEAR N/A 05/13/2013   Procedure: DILATATION & CURETTAGE/HYSTEROSCOPY WITH TRUCLEAR;  Surgeon: Margarette Asal, MD;  Location: [facility];  Service: Gynecology;  Laterality: N/A;  . INGUINAL HERNIA REPAIR Right 2009  . LAPAROSCOPIC VAGINAL HYSTERECTOMY WITH SALPINGECTOMY Bilateral 11/19/2015   Procedure: LAPAROSCOPIC ASSISTED VAGINAL HYSTERECTOMY WITH SALPINGECTOMY;  Surgeon: Molli Posey, MD;  Location: [facility];  Service: Gynecology;  Laterality: Bilateral;  need bed    Allergies  Allergen Reactions  . Augmentin [Amoxicillin-Pot Clavulanate] Swelling and Rash    swelling redness systemic  . Penicillins Swelling and Rash    "all cillins" systemic swelling redness Has patient had a PCN reaction causing immediate rash, facial/tongue/throat swelling, SOB or  lightheadedness with hypotension: Yes Has patient had a PCN reaction causing severe rash involving mucus membranes or skin necrosis: No Has patient had a PCN reaction that required hospitalization Yes Has patient had a PCN reaction occurring within the last 10 years: Yes If all of the above answers are "NO", then may proceed with Cephalosporin use.  . Pristiq [Desvenlafaxine Succinate Monohydrate] Other (See Comments)    Pt states that her throat felt like it was swollen or "closing up"  . Betadine [Povidone Iodine] Rash    Social History   Socioeconomic History  . Marital status: Married    Spouse name: Not on file  . Number of children: 2  . Years of education: BA  . Highest education level: Not on file  Occupational History    Employer: [employer]  Tobacco Use  . Smoking status: Never Smoker  . Smokeless tobacco: Never Used  Substance and Sexual Activity  . Alcohol use: Yes    Alcohol/week: 7.0 standard drinks    Types: 7 Glasses of wine per week    Comment: ONE DAILY WINE  . Drug use: No  . Sexual activity: Not on file  Other Topics Concern  . Not on file  Social History Narrative   Right handed    Caffeine use: hot tea    Social Determinants of Health   Financial Resource Strain:   . Difficulty of Paying Living Expenses:   Food Insecurity:   . Worried About Charity fundraiser in the Last Year:   . Arboriculturist in  the Last Year:   Transportation Needs:   . Film/video editor (Medical):   Marland Kitchen Lack of Transportation (Non-Medical):   Physical Activity:   . Days of Exercise per Week:   . Minutes of Exercise per Session:   Stress:   . Feeling of Stress :   Social Connections:   . Frequency of Communication with Friends and Family:   . Frequency of Social Gatherings with Friends and Family:   . Attends Religious Services:   . Active Member of Clubs or Organizations:   . Attends Archivist Meetings:   Marland Kitchen Marital Status:   Intimate Partner  Violence:   . Fear of Current or Ex-Partner:   . Emotionally Abused:   Marland Kitchen Physically Abused:   . Sexually Abused:     Family History  Problem Relation Age of Onset  . Cancer Father        colon    BP 110/78   Ht 5\' 3"  (1.6 m)   Wt 115 lb (52.2 kg)   BMI 20.37 kg/m   Review of Systems: See HPI above.     Objective:  Physical Exam:  Gen: NAD, comfortable in exam room  Left foot/ankle: No gross deformity, swelling, ecchymoses.  Preserved long arches.  Mild transverse arch collapse without callus. FROM ankle without pain.  5/5 strength all directions. TTP mildly plantar 2nd, 3rd MT heads.   1+ ant drawer and negative talar tilt.   Negative syndesmotic compression. Negative metatarsal squeeze. Thompsons test negative. NV intact distally.   Limited MSK U/s left foot:  No cortical irregularity, edema overlying cortex of 2nd-4th metatarsals.  Assessment & Plan:  1. Left foot pain - 2/2 metatarsalgia.  Sports insoles with metatarsal pads.  Activities as tolerated.  Tylenol, icing if needed.  She reported feeling some weakness of ankles so provided theraband exercises as well.  F/u in 1 month.

## 2019-07-21 ENCOUNTER — Ambulatory Visit (INDEPENDENT_AMBULATORY_CARE_PROVIDER_SITE_OTHER): Payer: BC Managed Care – PPO | Admitting: Physician Assistant

## 2019-07-21 ENCOUNTER — Other Ambulatory Visit: Payer: Self-pay

## 2019-07-21 DIAGNOSIS — L578 Other skin changes due to chronic exposure to nonionizing radiation: Secondary | ICD-10-CM

## 2019-07-21 DIAGNOSIS — Z1283 Encounter for screening for malignant neoplasm of skin: Secondary | ICD-10-CM

## 2019-07-21 DIAGNOSIS — D229 Melanocytic nevi, unspecified: Secondary | ICD-10-CM

## 2019-07-21 DIAGNOSIS — D1800 Hemangioma unspecified site: Secondary | ICD-10-CM | POA: Diagnosis not present

## 2019-07-21 DIAGNOSIS — L821 Other seborrheic keratosis: Secondary | ICD-10-CM

## 2019-07-21 DIAGNOSIS — L814 Other melanin hyperpigmentation: Secondary | ICD-10-CM

## 2019-07-22 ENCOUNTER — Encounter: Payer: Self-pay | Admitting: Physician Assistant

## 2019-07-22 NOTE — Progress Notes (Signed)
   New Patient   Subjective  Sherry Mclean is a 49 y.o. female who presents for the following: Skin Problem (Check spot on right chest x couple of years. Rough and raised spot after she was in the sun a couple weeks ago but now its smoother and seems better but not going away. ).    The following portions of the chart were reviewed this encounter and updated as appropriate:     Objective  Well appearing patient in no apparent distress; mood and affect are within normal limits.  All sun exposed areas plus back examined.  Objective  waist up , legs: No DN or signs of NMSC  Objective  Right chest: Stuck-on, waxy papules and plaques.    Assessment & Plan  Actinic skin damage (4) Left Upper Arm - Anterior; Right Upper Arm - Anterior; Left Thigh - Anterior; Right Thigh - Anterior  Screening exam for skin cancer waist up , legs  Observe. Yearly skin exams  Seborrheic keratosis Right chest  Observe. Return if changes  Lentigines - Scattered tan macules - Discussed due to sun exposure - Benign, observe - Call for any changes  Seborrheic Keratoses - Stuck-on, waxy, tan-brown papules and plaques  - Discussed benign etiology and prognosis. - Observe - Call for any changes  Melanocytic Nevi - Tan-brown and/or pink-flesh-colored symmetric macules and papules - Benign appearing on exam today - Observation - Call clinic for new or changing moles - Recommend daily use of broad spectrum spf 30+ sunscreen to sun-exposed areas.   Hemangiomas - Red papules - Discussed benign nature - Observe - Call for any changes  Actinic Damage - diffuse scaly erythematous macules with underlying dyspigmentation - Recommend daily broad spectrum sunscreen SPF 30+ to sun-exposed areas, reapply every 2 hours as needed.  - Call for new or changing lesions.  Skin cancer screening performed today.

## 2019-10-26 DIAGNOSIS — M79671 Pain in right foot: Secondary | ICD-10-CM | POA: Diagnosis not present

## 2020-01-02 ENCOUNTER — Telehealth: Payer: Self-pay | Admitting: Neurology

## 2020-01-02 NOTE — Telephone Encounter (Signed)
I called the patient, left message. I am calling about scheduling MRI of the brain to follow-up for a third ventricular cyst, last study showed some slight growth of the cyst.  If the patient is amenable to have another MRI, she is to contact our office.

## 2020-03-14 DIAGNOSIS — Z01419 Encounter for gynecological examination (general) (routine) without abnormal findings: Secondary | ICD-10-CM | POA: Diagnosis not present

## 2020-03-14 DIAGNOSIS — Z6821 Body mass index (BMI) 21.0-21.9, adult: Secondary | ICD-10-CM | POA: Diagnosis not present

## 2020-05-02 DIAGNOSIS — Z1231 Encounter for screening mammogram for malignant neoplasm of breast: Secondary | ICD-10-CM | POA: Diagnosis not present

## 2020-06-26 ENCOUNTER — Other Ambulatory Visit: Payer: Self-pay

## 2020-06-26 ENCOUNTER — Ambulatory Visit (INDEPENDENT_AMBULATORY_CARE_PROVIDER_SITE_OTHER): Payer: BC Managed Care – PPO | Admitting: Sports Medicine

## 2020-06-26 VITALS — BP 106/74 | Ht 64.0 in | Wt 115.0 lb

## 2020-06-26 DIAGNOSIS — M7740 Metatarsalgia, unspecified foot: Secondary | ICD-10-CM

## 2020-06-27 ENCOUNTER — Encounter: Payer: Self-pay | Admitting: Sports Medicine

## 2020-06-27 NOTE — Progress Notes (Signed)
Patient ID: Sherry Mclean, female   DOB: 01-27-1971, 50 y.o.   MRN: [id number]  Sherry Mclean presents today requesting custom orthotics.  She was last seen in the office in May of last year and diagnosed with metatarsalgia.  She was given green sports insoles and metatarsal pads which have been greatly helpful.  She has worn out those temporary inserts and would like to consider a more permanent custom orthotic.  Physical exam of her foot in the standing position shows fairly well-preserved longitudinal arches.  Mild transverse arch collapse bilaterally.  No soft tissue swelling.  No tenderness to palpation.  Good pulses.  Ambulating without a limp.  Custom orthotics were created for her today.  Metatarsal pads were added for her metatarsalgia.  She found them to be comfortable prior to leaving the office.  She may continue with activity as tolerated without restriction and will follow-up as needed.  Patient was fitted for a : standard, cushioned, semi-rigid orthotic. The orthotic was heated and afterward the patient stood on the orthotic blank positioned on the orthotic stand. The patient was positioned in subtalar neutral position and 10 degrees of ankle dorsiflexion in a weight bearing stance. After completion of molding, a stable base was applied to the orthotic blank. The blank was ground to a stable position for weight bearing. Size: 8 Base: Blue EVA Posting: none Additional orthotic padding: B/L metatarsal pads

## 2020-08-01 DIAGNOSIS — D7589 Other specified diseases of blood and blood-forming organs: Secondary | ICD-10-CM | POA: Diagnosis not present

## 2020-08-01 DIAGNOSIS — Z0000 Encounter for general adult medical examination without abnormal findings: Secondary | ICD-10-CM | POA: Diagnosis not present

## 2020-08-01 DIAGNOSIS — Z79899 Other long term (current) drug therapy: Secondary | ICD-10-CM | POA: Diagnosis not present

## 2020-08-01 DIAGNOSIS — Z23 Encounter for immunization: Secondary | ICD-10-CM | POA: Diagnosis not present

## 2020-08-01 DIAGNOSIS — Z1159 Encounter for screening for other viral diseases: Secondary | ICD-10-CM | POA: Diagnosis not present

## 2020-08-01 DIAGNOSIS — Z13220 Encounter for screening for lipoid disorders: Secondary | ICD-10-CM | POA: Diagnosis not present

## 2020-08-01 DIAGNOSIS — R0789 Other chest pain: Secondary | ICD-10-CM | POA: Diagnosis not present

## 2020-08-07 DIAGNOSIS — H10413 Chronic giant papillary conjunctivitis, bilateral: Secondary | ICD-10-CM | POA: Diagnosis not present

## 2020-08-07 DIAGNOSIS — H5213 Myopia, bilateral: Secondary | ICD-10-CM | POA: Diagnosis not present

## 2020-08-07 DIAGNOSIS — H524 Presbyopia: Secondary | ICD-10-CM | POA: Diagnosis not present

## 2021-01-14 ENCOUNTER — Encounter: Payer: Self-pay | Admitting: Plastic Surgery

## 2021-01-14 ENCOUNTER — Ambulatory Visit (INDEPENDENT_AMBULATORY_CARE_PROVIDER_SITE_OTHER): Payer: Self-pay | Admitting: Plastic Surgery

## 2021-01-14 ENCOUNTER — Other Ambulatory Visit: Payer: Self-pay

## 2021-01-14 VITALS — BP 128/69 | HR 59 | Ht 63.0 in | Wt 116.0 lb

## 2021-01-14 DIAGNOSIS — Z411 Encounter for cosmetic surgery: Secondary | ICD-10-CM

## 2021-01-14 DIAGNOSIS — T8543XA Leakage of breast prosthesis and implant, initial encounter: Secondary | ICD-10-CM

## 2021-01-15 NOTE — Progress Notes (Signed)
Referring Provider Molli Posey, [address] [place],  [place] [address]   CC:  Concern for implant rupture  Sherry Mclean is an 50 y.o. female.  HPI: The patient is a 50 year old status post breast augmentation by Dr. Stephanie Coup in 2013.  She thinks that she has 119 cc silicone Sientra implants in place but is unsure.  She is unsure about her replacement but believes it is under the muscle.  She notes pain and pulling for about 1 year from the right inframammary fold and a cold sensation.  Her last mammogram was in spring 2022 and she states was normal.  Allergies  Allergen Reactions   Augmentin [Amoxicillin-Pot Clavulanate] Swelling and Rash    swelling redness systemic   Penicillins Swelling and Rash    "all cillins" systemic swelling redness Has patient had a PCN reaction causing immediate rash, facial/tongue/throat swelling, SOB or lightheadedness with hypotension: Yes Has patient had a PCN reaction causing severe rash involving mucus membranes or skin necrosis: No Has patient had a PCN reaction that required hospitalization Yes Has patient had a PCN reaction occurring within the last 10 years: Yes If all of the above answers are "NO", then may proceed with Cephalosporin use.   Pristiq [Desvenlafaxine Succinate Monohydrate] Other (See Comments)    Pt states that her throat felt like it was swollen or "closing up"   Betadine [Povidone Iodine] Rash    Outpatient Encounter Medications as of 01/14/2021  Medication Sig   BIOTIN PO Take 1 tablet by mouth daily.    metroNIDAZOLE (FLAGYL) 500 MG tablet metronidazole 500 mg tablet  TAKE 1TAB TWICE A DAY FOR 5DAYS,WAIT 2 WEEKS,1TAB TWICE A WEEK X2 WEEKS,1TAB EVERY WEEK FOR 6 WEEKS   MULTIPLE VITAMIN PO Take by mouth.   valACYclovir (VALTREX) 500 MG tablet Take 500 mg by mouth.   Emollient (CETAPHIL) cream Apply 1 application topically daily as needed (Dry skin).   No facility-administered encounter  medications on file as of 01/14/2021.     Past Medical History:  Diagnosis Date   Abnormal uterine bleeding (AUB)    Uterine fibroid    Wears contact lenses     Past Surgical History:  Procedure Laterality Date   BREAST ENHANCEMENT SURGERY  2013   DILATATION & CURETTAGE/HYSTEROSCOPY WITH TRUECLEAR N/A 05/13/2013   Procedure: DILATATION & CURETTAGE/HYSTEROSCOPY WITH TRUCLEAR;  Surgeon: Margarette Asal, MD;  Location: [facility];  Service: Gynecology;  Laterality: N/A;   INGUINAL HERNIA REPAIR Right 2009   LAPAROSCOPIC VAGINAL HYSTERECTOMY WITH SALPINGECTOMY Bilateral 11/19/2015   Procedure: LAPAROSCOPIC ASSISTED VAGINAL HYSTERECTOMY WITH SALPINGECTOMY;  Surgeon: Molli Posey, MD;  Location: [facility];  Service: Gynecology;  Laterality: Bilateral;  need bed    Family History  Problem Relation Age of Onset   Cancer Father        colon    Social History   Social History Narrative   Right handed    Caffeine use: hot tea      Review of Systems General: Denies fevers, chills, weight loss CV: Denies chest pain, shortness of breath, palpitations   Physical Exam Vitals with BMI 01/14/2021 06/26/2020 07/04/2019  Height 5\' 3"  5\' 4"  5\' 3"   Weight 116 lbs 115 lbs 115 lbs  BMI 20.55 41.74 08.14  Systolic 481 856 314  Diastolic 69 74 78  Pulse 59 - -    General:  No acute distress,  Alert and oriented, Non-Toxic, Normal speech and affect Breast:  Symmetric breast augmentation, good symmetry.  No masses palpable.  Relatively thin tissue covering implants.  Incisions intact.     Assessment/Plan Patient is status post placement of silicone implants.  She has not followed these with MRI and is concerned about rupture.  MRI ordered as the next step.  Lennice Sites 01/15/2021, 2:12 PM

## 2021-01-23 DIAGNOSIS — S30860A Insect bite (nonvenomous) of lower back and pelvis, initial encounter: Secondary | ICD-10-CM | POA: Diagnosis not present

## 2021-02-01 ENCOUNTER — Other Ambulatory Visit: Payer: BC Managed Care – PPO

## 2021-02-08 ENCOUNTER — Institutional Professional Consult (permissible substitution): Payer: BC Managed Care – PPO | Admitting: Plastic Surgery

## 2021-02-13 ENCOUNTER — Inpatient Hospital Stay: Admission: RE | Admit: 2021-02-13 | Payer: BC Managed Care – PPO | Source: Ambulatory Visit

## 2021-03-07 ENCOUNTER — Ambulatory Visit
Admission: RE | Admit: 2021-03-07 | Discharge: 2021-03-07 | Disposition: A | Discharge status: Home / Self Care | Payer: BC Managed Care – PPO | Source: Ambulatory Visit | Attending: Plastic Surgery | Admitting: Plastic Surgery

## 2021-03-07 DIAGNOSIS — Z9882 Breast implant status: Secondary | ICD-10-CM | POA: Diagnosis not present

## 2021-03-07 DIAGNOSIS — T8543XA Leakage of breast prosthesis and implant, initial encounter: Secondary | ICD-10-CM

## 2021-03-14 ENCOUNTER — Telehealth: Payer: Self-pay | Admitting: Plastic Surgery

## 2021-03-14 NOTE — Telephone Encounter (Signed)
Patient called and left voicemail and was wondering if her MRI results have come in.   Please follow up with patient and let front desk know if scheduling for a follow up or televisit is needed.

## 2021-03-14 NOTE — Telephone Encounter (Signed)
MRI has resulted. No evidence of implant rupture on either side.  No findings concerning for malignancy.  She can follow-up with Dr. Erin Hearing.  Thanks

## 2021-03-14 NOTE — Telephone Encounter (Signed)
Called patient back and advised MRI showed no rupture of implants, nor concerns for malignancy. Patient understood,had no further questions and will follow up with Dr.Luppens on 03/15/2021

## 2021-03-15 ENCOUNTER — Other Ambulatory Visit: Payer: Self-pay

## 2021-03-15 ENCOUNTER — Ambulatory Visit (INDEPENDENT_AMBULATORY_CARE_PROVIDER_SITE_OTHER): Payer: BC Managed Care – PPO | Admitting: Plastic Surgery

## 2021-03-15 ENCOUNTER — Encounter: Payer: Self-pay | Admitting: Plastic Surgery

## 2021-03-15 DIAGNOSIS — T8543XA Leakage of breast prosthesis and implant, initial encounter: Secondary | ICD-10-CM | POA: Diagnosis not present

## 2021-03-15 NOTE — Progress Notes (Addendum)
   Referring Provider Molli Posey, MD [address],  [place] [address]   CC: Status post MRI for ruptured possible implant.     KAYANI RAPAPORT is an 51 y.o. female.  HPI: Patient is a 51 year old who had concerns about ruptured silicone implants.  They were placed in 2013.  She had noticed some pulling under the right inframammary fold and a cold sensation.  She recently underwent MRI.  Review of Systems General: No fever chills or other constitutional symptoms.  Physical Exam Vitals with BMI 01/14/2021 06/26/2020 07/04/2019  Height 5\' 3"  5\' 4"  5\' 3"   Weight 116 lbs 115 lbs 115 lbs  BMI 20.55 65.79 03.83  Systolic 338 329 191  Diastolic 69 74 78  Pulse 59 - -    Exam deferred televisit.  MRI: IMPRESSION: No evidence of breast implant rupture or abnormality.   RECOMMENDATION: Recommend continued annual screening mammography.  Assessment/Plan Patient is concerned about implant rupture but did not have any evidence of rupture on MRI.  I am happy to continue to follow her for her breast implants and repeat MRIs when appropriate.  Time based coding phone visit: Time on call:  5 min Call type: voice Patient location: home Physician location:  office   Lennice Sites 03/15/2021, 1:23 PM

## 2021-07-10 DIAGNOSIS — Z01419 Encounter for gynecological examination (general) (routine) without abnormal findings: Secondary | ICD-10-CM | POA: Diagnosis not present

## 2021-07-10 DIAGNOSIS — Z124 Encounter for screening for malignant neoplasm of cervix: Secondary | ICD-10-CM | POA: Diagnosis not present

## 2021-07-10 DIAGNOSIS — Z1151 Encounter for screening for human papillomavirus (HPV): Secondary | ICD-10-CM | POA: Diagnosis not present

## 2021-07-10 DIAGNOSIS — Z6820 Body mass index (BMI) 20.0-20.9, adult: Secondary | ICD-10-CM | POA: Diagnosis not present

## 2021-07-15 ENCOUNTER — Other Ambulatory Visit: Payer: Self-pay | Admitting: Obstetrics and Gynecology

## 2021-07-15 DIAGNOSIS — R928 Other abnormal and inconclusive findings on diagnostic imaging of breast: Secondary | ICD-10-CM

## 2021-07-18 ENCOUNTER — Ambulatory Visit
Admission: RE | Admit: 2021-07-18 | Discharge: 2021-07-18 | Disposition: A | Discharge status: Home / Self Care | Payer: BC Managed Care – PPO | Source: Ambulatory Visit | Attending: Obstetrics and Gynecology | Admitting: Obstetrics and Gynecology

## 2021-07-18 ENCOUNTER — Ambulatory Visit: Payer: BC Managed Care – PPO

## 2021-07-18 ENCOUNTER — Other Ambulatory Visit: Payer: Self-pay | Admitting: Obstetrics and Gynecology

## 2021-07-18 DIAGNOSIS — R928 Other abnormal and inconclusive findings on diagnostic imaging of breast: Secondary | ICD-10-CM

## 2021-07-18 DIAGNOSIS — R922 Inconclusive mammogram: Secondary | ICD-10-CM | POA: Diagnosis not present

## 2022-06-15 DIAGNOSIS — L237 Allergic contact dermatitis due to plants, except food: Secondary | ICD-10-CM | POA: Diagnosis not present

## 2022-06-18 DIAGNOSIS — L814 Other melanin hyperpigmentation: Secondary | ICD-10-CM | POA: Diagnosis not present

## 2022-06-18 DIAGNOSIS — L237 Allergic contact dermatitis due to plants, except food: Secondary | ICD-10-CM | POA: Diagnosis not present

## 2022-06-18 DIAGNOSIS — L821 Other seborrheic keratosis: Secondary | ICD-10-CM | POA: Diagnosis not present

## 2022-06-18 DIAGNOSIS — D225 Melanocytic nevi of trunk: Secondary | ICD-10-CM | POA: Diagnosis not present

## 2022-07-16 ENCOUNTER — Encounter: Payer: Self-pay | Admitting: Family Medicine

## 2022-07-16 ENCOUNTER — Other Ambulatory Visit: Payer: Self-pay

## 2022-07-16 ENCOUNTER — Ambulatory Visit (INDEPENDENT_AMBULATORY_CARE_PROVIDER_SITE_OTHER): Payer: BC Managed Care – PPO | Admitting: Family Medicine

## 2022-07-16 VITALS — BP 116/72 | Ht 64.0 in | Wt 115.0 lb

## 2022-07-16 DIAGNOSIS — M25572 Pain in left ankle and joints of left foot: Secondary | ICD-10-CM

## 2022-07-16 DIAGNOSIS — M84362A Stress fracture, left tibia, initial encounter for fracture: Secondary | ICD-10-CM | POA: Diagnosis not present

## 2022-07-16 NOTE — Progress Notes (Signed)
PCP: Richarda Overlie, MD  Subjective:   HPI: Patient is a 52 y.o. female here for left ankle pain.  Patient reports she's had lower leg cramping off and on for several months. She ran 8 miles three weeks ago - doesn't normally run but works out regularly. Then went to the [place] about [place] trip which involved a lot of walking. Started to feel pain especially on Thursday and Friday lateral left lower leg. Associated swelling but no bruising. No history of DVT.  Past Medical History:  Diagnosis Date   Abnormal uterine bleeding (AUB)    Uterine fibroid    Wears contact lenses     Current Outpatient Medications on File Prior to Visit  Medication Sig Dispense Refill   BIOTIN PO Take 1 tablet by mouth daily.      Emollient (CETAPHIL) cream Apply 1 application topically daily as needed (Dry skin).     metroNIDAZOLE (FLAGYL) 500 MG tablet metronidazole 500 mg tablet  TAKE 1TAB TWICE A DAY FOR 5DAYS,WAIT 2 WEEKS,1TAB TWICE A WEEK X2 WEEKS,1TAB EVERY WEEK FOR 6 WEEKS     MULTIPLE VITAMIN PO Take by mouth.     valACYclovir (VALTREX) 500 MG tablet Take 500 mg by mouth.  2   No current facility-administered medications on file prior to visit.    Past Surgical History:  Procedure Laterality Date   AUGMENTATION MAMMAPLASTY Bilateral    BREAST ENHANCEMENT SURGERY  2013   DILATATION & CURETTAGE/HYSTEROSCOPY WITH TRUECLEAR N/A 05/13/2013   Procedure: DILATATION & CURETTAGE/HYSTEROSCOPY WITH TRUCLEAR;  Surgeon: Meriel Pica, MD;  Location: [facility];  Service: Gynecology;  Laterality: N/A;   INGUINAL HERNIA REPAIR Right 2009   LAPAROSCOPIC VAGINAL HYSTERECTOMY WITH SALPINGECTOMY Bilateral 11/19/2015   Procedure: LAPAROSCOPIC ASSISTED VAGINAL HYSTERECTOMY WITH SALPINGECTOMY;  Surgeon: Richarda Overlie, MD;  Location: [facility] [place];  Service: Gynecology;  Laterality: Bilateral;  need bed    Allergies  Allergen Reactions   Augmentin [Amoxicillin-Pot Clavulanate]  Swelling and Rash    swelling redness systemic   Penicillins Swelling and Rash    "all cillins" systemic swelling redness Has patient had a PCN reaction causing immediate rash, facial/tongue/throat swelling, SOB or lightheadedness with hypotension: Yes Has patient had a PCN reaction causing severe rash involving mucus membranes or skin necrosis: No Has patient had a PCN reaction that required hospitalization Yes Has patient had a PCN reaction occurring within the last 10 years: Yes If all of the above answers are "NO", then may proceed with Cephalosporin use.   Pristiq [Desvenlafaxine Succinate Monohydrate] Other (See Comments)    Pt states that her throat felt like it was swollen or "closing up"   Betadine [Povidone Iodine] Rash    BP 116/72   Ht 5\' 4"  (1.626 m)   Wt 115 lb (52.2 kg)   BMI 19.74 kg/m       No data to display              No data to display              Objective:  Physical Exam:  Gen: NAD, comfortable in exam room  Left lower leg: Mild swelling distal lower leg, ankle anterolaterally.  No bruising or other deformity. FROM ankle with normal strength. TTP focally distal fibula above level of ankle joint.  Calf soft and nontender. Negative ant drawer and negative talar tilt.   Thompsons test negative. NV intact distally.  Limited MSK u/s left lower leg:  small cortical irregularity  in area of tenderness with mild edema overlying cortex and soft tissue swelling noted.  Mild neovascularity.  Medial and lateral gastroc normal.  No bakers cyst popliteal fossa.  Venous structures compressible.  Assessment & Plan:  1. Left distal fibula stress fracture - Long aircast.  Icing, tylenol or ibuprofen if needed.  Come out of aircast to do motion exercises of ankle.  F/u in 2 weeks for reevaluation.  2. Leg cramping - intermittent.  She has had labwork recently - will try to fax these to [place].  Consider BMP, Mg, CBC with diff, ferritin depending on what's been  drawn already.  Discussed hydration.

## 2022-07-16 NOTE — Patient Instructions (Signed)
You have a distal fibula stress fracture. Wear the boot or long aircast when up and walking around. Activities as tolerated except no running, jumping activities, or any activity that causes pain. Icing 15 minutes at a time 3-4 times a day. Tylenol or ibuprofen only if needed. Follow up with me in 2 weeks for reevaluation. You can have recent labwork faxed to [place] [phone]) or send pictures through mychart if you want.  Consider basic metabolic panel, magnesium, CBC with diff, and ferritin being checked with the cramping. Ensure you're hydrating enough like we discussed too.

## 2022-07-29 ENCOUNTER — Ambulatory Visit (INDEPENDENT_AMBULATORY_CARE_PROVIDER_SITE_OTHER): Payer: BC Managed Care – PPO | Admitting: Family Medicine

## 2022-07-29 ENCOUNTER — Encounter: Payer: Self-pay | Admitting: Family Medicine

## 2022-07-29 ENCOUNTER — Other Ambulatory Visit: Payer: Self-pay

## 2022-07-29 VITALS — BP 120/80 | Ht 64.0 in | Wt 115.0 lb

## 2022-07-29 DIAGNOSIS — M25572 Pain in left ankle and joints of left foot: Secondary | ICD-10-CM

## 2022-07-29 NOTE — Patient Instructions (Signed)
Phase 2: Initiation of weight-bearing activity (typical duration: 3 weeks) Use air splint for all activity Begin walk/run program: Run for 4 minutes then walk for 1 minute for 20 minutes total during week 1; 4 sessions per week maximum; do not repeat on single day. Cross train on stationary cycle to complete 45 minutes total training daily During week 2, increase total walk/run to 25 minutes during each session with 5 sessions per week maximum During week 3, increase total walk/run time to 30 minutes during each session with 6 sessions per week maximum Continue strength training exercises as in Phase 1 on non-running days Apply ice to fracture site for 10 to 20 minutes after each training session Progress to Phase 3 if able to run continuously, without air splint, for 10 minutes without limp or pain; patient may use a compression sleeve or taping if this makes running more comfortable, but if unable to run without an air splint because of pain, the patient should continue Phase 2 for an additional week  Phase 3: Weaning from air splint (typical duration: 2 weeks)  Week 1 - Begin advanced walk/run program: Start by running 10 minutes then walking 2 minutes for 22 minutes total (1 session); perform 1 session each day for 2 days Progress to running 12 minutes then walking 2 minutes for 26 minutes total (1 session); perform 1 session each day for 2 days Progress to running 15 minutes then walking 2 minutes for 32 minutes total (1 session); perform 1 session each day for 2 days. Rest 1 day.  Week 2 - Perform 3 sessions (1 session per day) of 25 minutes of continuous running followed by 3 sessions (1 session per day) of 30 minutes of continuous running If progressive running is tolerated without limp or pain, patient may progress gradually to normal training Apply ice to fracture site for 10 to 20 minutes after each training session

## 2022-07-29 NOTE — Progress Notes (Signed)
PCP: Richarda Overlie, MD  Subjective:   HPI: Patient is a 52 y.o. female here for left ankle pain.  5/15: Patient reports she's had lower leg cramping off and on for several months. She ran 8 miles three weeks ago - doesn't normally run but works out regularly. Then went to the [place] for about [place] day trip which involved a lot of walking. Started to feel pain especially on Thursday and Friday lateral left lower leg. Associated swelling but no bruising. No history of DVT.  5/28: Patient reports she's improving though with notable soreness lateral left ankle. Wearing the aircast with ambulation. She was able to do the [facility] walk event with soreness afterwards but able to do the whole thing and it was a success. Some swelling but no bruising. Worse with dorsiflexion of ankle.  Past Medical History:  Diagnosis Date   Abnormal uterine bleeding (AUB)    Uterine fibroid    Wears contact lenses     Current Outpatient Medications on File Prior to Visit  Medication Sig Dispense Refill   BIOTIN PO Take 1 tablet by mouth daily.      Emollient (CETAPHIL) cream Apply 1 application topically daily as needed (Dry skin).     metroNIDAZOLE (FLAGYL) 500 MG tablet metronidazole 500 mg tablet  TAKE 1TAB TWICE A DAY FOR 5DAYS,WAIT 2 WEEKS,1TAB TWICE A WEEK X2 WEEKS,1TAB EVERY WEEK FOR 6 WEEKS     MULTIPLE VITAMIN PO Take by mouth.     valACYclovir (VALTREX) 500 MG tablet Take 500 mg by mouth.  2   No current facility-administered medications on file prior to visit.    Past Surgical History:  Procedure Laterality Date   AUGMENTATION MAMMAPLASTY Bilateral    BREAST ENHANCEMENT SURGERY  2013   DILATATION & CURETTAGE/HYSTEROSCOPY WITH TRUECLEAR N/A 05/13/2013   Procedure: DILATATION & CURETTAGE/HYSTEROSCOPY WITH TRUCLEAR;  Surgeon: Meriel Pica, MD;  Location: [facility];  Service: Gynecology;  Laterality: N/A;   INGUINAL HERNIA REPAIR Right 2009   LAPAROSCOPIC VAGINAL HYSTERECTOMY  WITH SALPINGECTOMY Bilateral 11/19/2015   Procedure: LAPAROSCOPIC ASSISTED VAGINAL HYSTERECTOMY WITH SALPINGECTOMY;  Surgeon: Richarda Overlie, MD;  Location: [facility];  Service: Gynecology;  Laterality: Bilateral;  need bed    Allergies  Allergen Reactions   Augmentin [Amoxicillin-Pot Clavulanate] Swelling and Rash    swelling redness systemic   Penicillins Swelling and Rash    "all cillins" systemic swelling redness Has patient had a PCN reaction causing immediate rash, facial/tongue/throat swelling, SOB or lightheadedness with hypotension: Yes Has patient had a PCN reaction causing severe rash involving mucus membranes or skin necrosis: No Has patient had a PCN reaction that required hospitalization Yes Has patient had a PCN reaction occurring within the last 10 years: Yes If all of the above answers are "NO", then may proceed with Cephalosporin use.   Pristiq [Desvenlafaxine Succinate Monohydrate] Other (See Comments)    Pt states that her throat felt like it was swollen or "closing up"   Betadine [Povidone Iodine] Rash    BP 120/80 (BP Location: Left Arm, Patient Position: Sitting)   Ht 5\' 4"  (1.626 m)   Wt 115 lb (52.2 kg)   BMI 19.74 kg/m       No data to display              No data to display              Objective:  Physical Exam:  Gen: NAD, comfortable in exam room  Left  lower leg: Mild soft tissue swelling.  No bruising, other deformity. FROM with pain on dorsiflexion felt lateral ankle. TTP over distal fibula, peroneal tendons Negative ant drawer and negative talar tilt.   NV intact distally.  Limited MSK u/s left lower leg: forming callus noted distal fibula stress fracture.  Mild neovascularity again in area of fracture.  Assessment & Plan:  1. Left distal fibula stress fracture - Excellent healing noted on ultrasound.  Reassured patient.  Reviewed fibula stress fracture protocol.  Continue in aircast.  Icing, tylenol or  ibuprofen if needed.  F/u in 4 weeks.

## 2022-08-04 ENCOUNTER — Ambulatory Visit: Payer: BC Managed Care – PPO | Admitting: Family Medicine

## 2022-08-07 DIAGNOSIS — Z79899 Other long term (current) drug therapy: Secondary | ICD-10-CM | POA: Diagnosis not present

## 2022-08-07 DIAGNOSIS — Z0000 Encounter for general adult medical examination without abnormal findings: Secondary | ICD-10-CM | POA: Diagnosis not present

## 2022-09-03 ENCOUNTER — Ambulatory Visit: Payer: BC Managed Care – PPO | Admitting: Sports Medicine

## 2022-09-08 DIAGNOSIS — Z01419 Encounter for gynecological examination (general) (routine) without abnormal findings: Secondary | ICD-10-CM | POA: Diagnosis not present

## 2022-09-08 DIAGNOSIS — Z6820 Body mass index (BMI) 20.0-20.9, adult: Secondary | ICD-10-CM | POA: Diagnosis not present

## 2022-09-08 DIAGNOSIS — Z1231 Encounter for screening mammogram for malignant neoplasm of breast: Secondary | ICD-10-CM | POA: Diagnosis not present

## 2022-11-10 DIAGNOSIS — L089 Local infection of the skin and subcutaneous tissue, unspecified: Secondary | ICD-10-CM | POA: Diagnosis not present

## 2022-11-10 DIAGNOSIS — T63461A Toxic effect of venom of wasps, accidental (unintentional), initial encounter: Secondary | ICD-10-CM | POA: Diagnosis not present

## 2022-11-10 DIAGNOSIS — B880 Other acariasis: Secondary | ICD-10-CM | POA: Diagnosis not present

## 2022-11-18 DIAGNOSIS — Z800 Family history of malignant neoplasm of digestive organs: Secondary | ICD-10-CM | POA: Diagnosis not present

## 2022-11-18 DIAGNOSIS — K648 Other hemorrhoids: Secondary | ICD-10-CM | POA: Diagnosis not present

## 2022-11-18 DIAGNOSIS — Z1211 Encounter for screening for malignant neoplasm of colon: Secondary | ICD-10-CM | POA: Diagnosis not present

## 2023-01-19 IMAGING — MG MM DIGITAL DIAGNOSTIC UNILAT*R* W/ TOMO W/ CAD
4 series · 4 of 12 positions shown · non-contrast
Comparison: Previous exam(s).

CLINICAL DATA: Patient returns today to evaluate a possible RIGHT
breast asymmetry questioned on recent screening mammogram.

EXAM:
DIGITAL DIAGNOSTIC UNILATERAL RIGHT MAMMOGRAM WITH TOMOSYNTHESIS AND
CAD
TECHNIQUE: Right digital diagnostic mammography and breast tomosynthesis was
performed. The images were evaluated with computer-aided detection.

[R ML synth-2D]
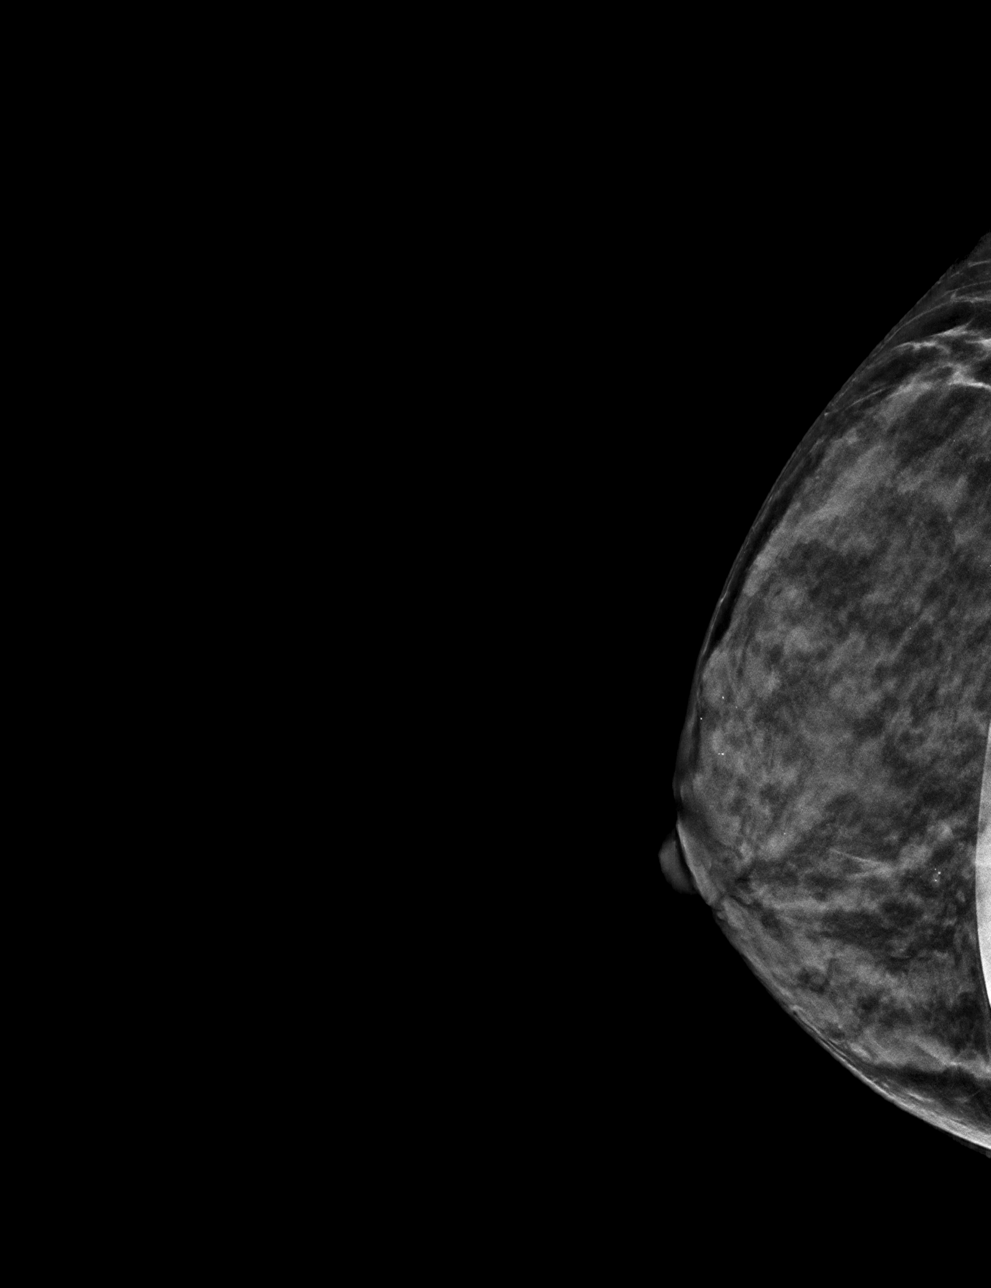

[R MLO synth-2D]
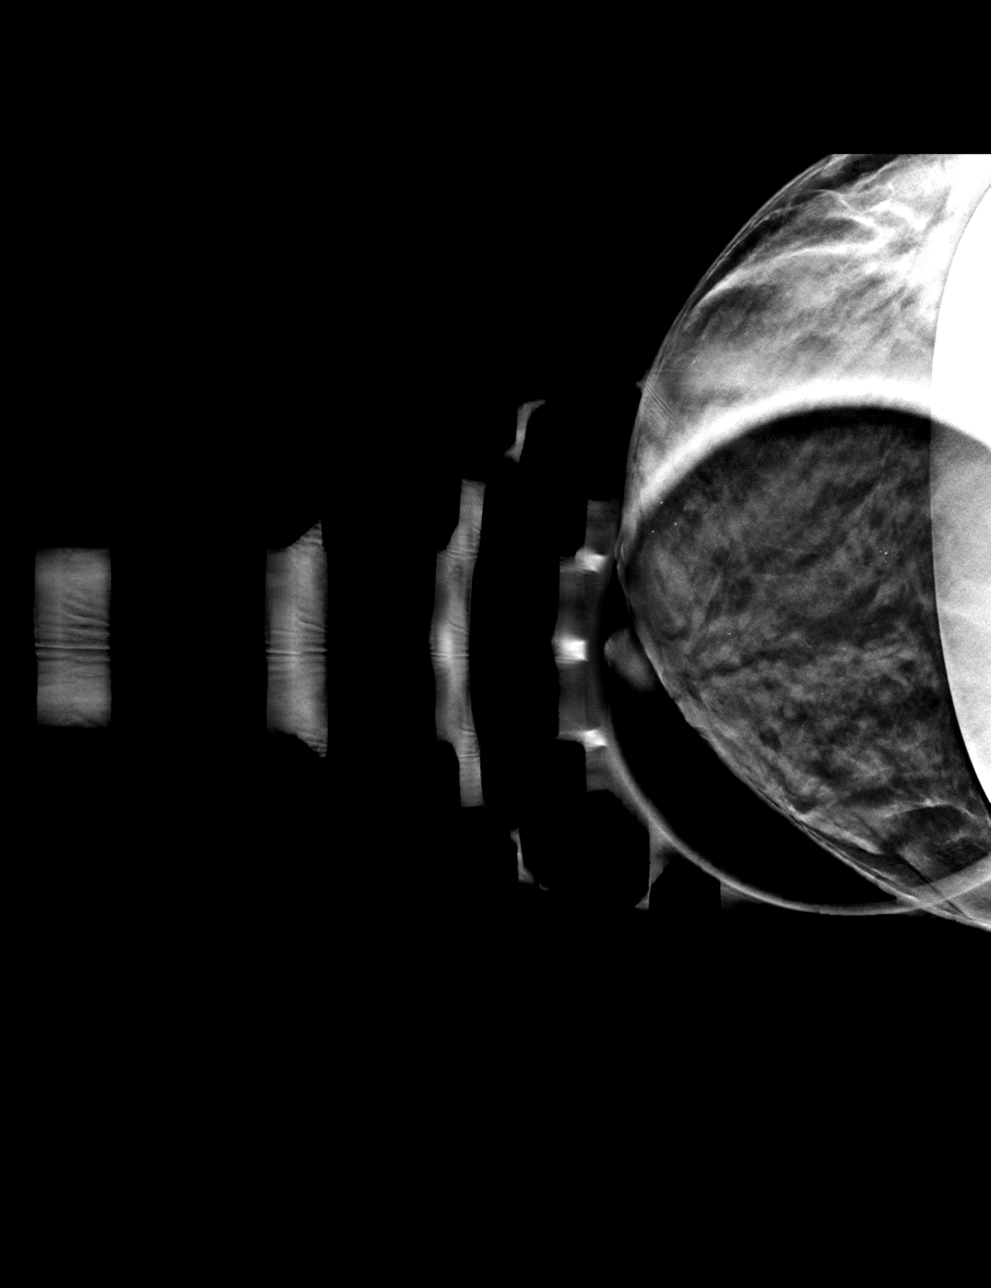

[R MLO tomo · tomo slice 19/38.0]
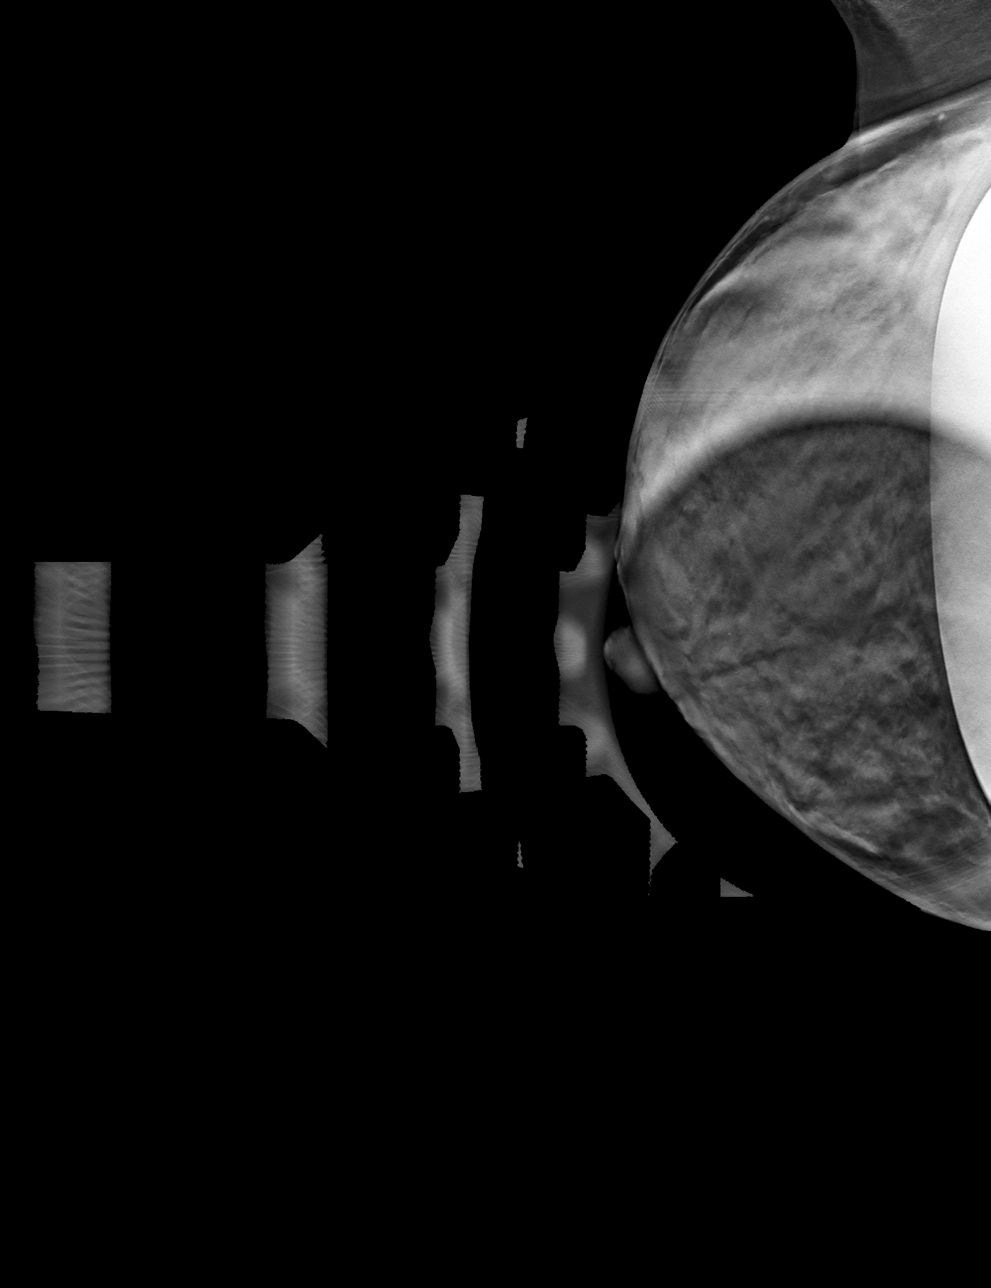

[R ML tomo · tomo slice 19/38.0]
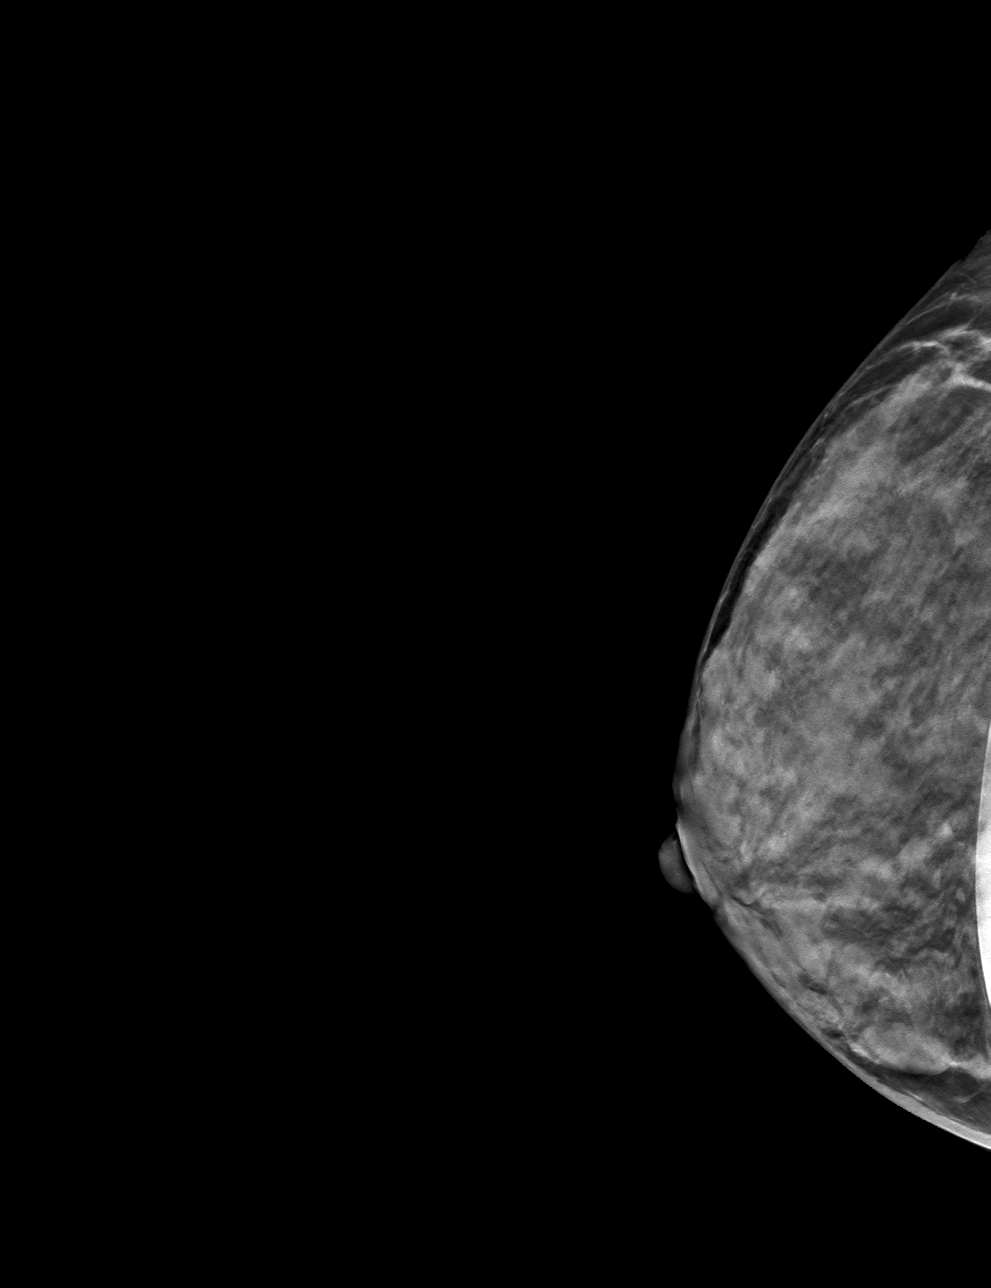

[4 of 12 positions shown; findings below may reference images not displayed]

ACR Breast Density Category d: The breast tissue is extremely dense,
which lowers the sensitivity of mammography.
FINDINGS: On today's additional diagnostic views, including spot compression
with 3D tomosynthesis, there is no persistent asymmetry within lower
RIGHT breast indicating superimposition of normal dense
fibroglandular tissues. The patient has retropectoral implants.
IMPRESSION: No evidence of malignancy.

Patient may return to routine annual bilateral screening mammogram
schedule.

RECOMMENDATION:
Screening mammogram in one year.(Code:EP-A-SLT)

I have discussed the findings and recommendations with the patient.
If applicable, a reminder letter will be sent to the patient
regarding the next appointment.

BI-RADS CATEGORY  1: Negative.

## 2023-03-11 ENCOUNTER — Ambulatory Visit: Payer: BC Managed Care – PPO | Admitting: Family Medicine

## 2023-03-11 ENCOUNTER — Other Ambulatory Visit: Payer: Self-pay

## 2023-03-11 ENCOUNTER — Encounter: Payer: Self-pay | Admitting: Family Medicine

## 2023-03-11 VITALS — BP 122/78 | Ht 64.0 in | Wt 120.0 lb

## 2023-03-11 DIAGNOSIS — M25571 Pain in right ankle and joints of right foot: Secondary | ICD-10-CM

## 2023-03-11 DIAGNOSIS — Q742 Other congenital malformations of lower limb(s), including pelvic girdle: Secondary | ICD-10-CM | POA: Diagnosis not present

## 2023-03-11 NOTE — Patient Instructions (Addendum)
 Please do your ankle exercises as provided  Please avoid using your right leg for any significant walking/exercise over the next 2 weeks.  Please continue wearing your Aircast as much as possible, you may take it off for showering or when you go to sleep but if you plan on putting any weight on the right leg then please wear the cast.

## 2023-03-11 NOTE — Progress Notes (Signed)
 PCP: Johnnye Ade, MD  Chief Complaint: right ankle pain Subjective:   HPI: Patient is a 53 y.o. female here for right ankle pain.  Patient states that she was walking in high heels approximately 2 weeks ago and did notice that she inverted her foot.  Patient states that at that time she not feel any significant pain but over the past 2 weeks she has noted pain whenever she was walking.  Patient does note that it is getting better but she still notes the pain is a 5 out of 10.  Otherwise denies any other trauma to the area.  Patient denies any numbness tingling but does note that the pain radiates slightly superiorly to the lateral aspect of her distal leg.  Past Medical History:  Diagnosis Date   Abnormal uterine bleeding (AUB)    Uterine fibroid    Wears contact lenses     Current Outpatient Medications on File Prior to Visit  Medication Sig Dispense Refill   BIOTIN PO Take 1 tablet by mouth daily.      Emollient (CETAPHIL) cream Apply 1 application topically daily as needed (Dry skin).     metroNIDAZOLE (FLAGYL) 500 MG tablet metronidazole 500 mg tablet  TAKE 1TAB TWICE A DAY FOR 5DAYS,WAIT 2 WEEKS,1TAB TWICE A WEEK X2 WEEKS,1TAB EVERY WEEK FOR 6 WEEKS     MULTIPLE VITAMIN PO Take by mouth.     valACYclovir (VALTREX) 500 MG tablet Take 500 mg by mouth.  2   No current facility-administered medications on file prior to visit.    Past Surgical History:  Procedure Laterality Date   AUGMENTATION MAMMAPLASTY Bilateral    BREAST ENHANCEMENT SURGERY  2013   DILATATION & CURETTAGE/HYSTEROSCOPY WITH TRUECLEAR N/A 05/13/2013   Procedure: DILATATION & CURETTAGE/HYSTEROSCOPY WITH TRUCLEAR;  Surgeon: Ade CHRISTELLA Johnnye, MD;  Location: [facility];  Service: Gynecology;  Laterality: N/A;   INGUINAL HERNIA REPAIR Right 2009   LAPAROSCOPIC VAGINAL HYSTERECTOMY WITH SALPINGECTOMY Bilateral 11/19/2015   Procedure: LAPAROSCOPIC ASSISTED VAGINAL HYSTERECTOMY WITH SALPINGECTOMY;  Surgeon: Ade Johnnye, MD;  Location: [facility] [place];  Service: Gynecology;  Laterality: Bilateral;  need bed    Allergies  Allergen Reactions   Augmentin [Amoxicillin-Pot Clavulanate] Swelling and Rash    swelling redness systemic   Penicillins Swelling and Rash    all cillins systemic swelling redness Has patient had a PCN reaction causing immediate rash, facial/tongue/throat swelling, SOB or lightheadedness with hypotension: Yes Has patient had a PCN reaction causing severe rash involving mucus membranes or skin necrosis: No Has patient had a PCN reaction that required hospitalization Yes Has patient had a PCN reaction occurring within the last 10 years: Yes If all of the above answers are NO, then may proceed with Cephalosporin use.   Pristiq [Desvenlafaxine Succinate Monohydrate] Other (See Comments)    Pt states that her throat felt like it was swollen or closing up   Betadine [Povidone Iodine] Rash    BP 122/78   Ht 5' 4 (1.626 m)   Wt 120 lb (54.4 kg)   BMI 20.60 kg/m       No data to display              No data to display              Objective:  Physical Exam:  Gen: NAD, comfortable in exam room  Ankle/Foot, right:  No visible erythema, swelling, ecchymosis, or bony deformity.  Normal eversion/inversion stress testing. Range of motion is full  in all directions. Strength is 5/5 in all directions. No tenderness on posterior aspects of lateral and medial malleolus; Stable lateral and medial ligaments; Talar dome nontender; No plantar calcaneal tenderness; No tenderness over the navicular prominence or base of the 5th MT; No tenderness over cuboid;  Provocative Testing:   - Anterior Drawer: NEG  - Talar Tilt: NEG  - Tib/Fib Squeeze Test: POS; Calcaneal Squeeze Test: NEG     U/S right leg: Ultrasound of the right leg shows a cortical irregularity along the fibula at the distal aspect.  There is a fibrous cap being formed over the cortical irregularity  which does appear to be consistent with a healing fracture.  Rest of the fibula appears within normal limits.  Peroneals do appear to be intact without any tearing though there is some noted fluid accumulation surrounding the bilateral peroneal's.    Impression: Cortical irregularity of the fibula with a fibrous consistent with suspected healing stress fracture.  Intact peroneal tendons without any concerns for tear with some fluid accumulation consistent with inflammation.  Assessment & Plan:  1. 1. Acute right ankle pain (Primary) -Patient likely dealing with a sprained ankle as well as a notable possible cortical irregularity along the fibula consistent with a healing fracture.  This could be a previous stress fracture.  At this time the fracture has some healing component to it so we will go ahead and treat it like a healing fracture. -Will do Aircast of the right ankle for the next 2 weeks and have patient follow-up at that time, can rescan and evaluate if fracture has healed. -Will also go ahead and have patient start doing ankle strengthening exercises with a band. - US  LIMITED JOINT SPACE STRUCTURES LOW RIGHT; Future  2. Abnormality of fibula     Reyne Bustle MD, PGY-4  Sports Medicine Fellow [facility]

## 2023-09-16 DIAGNOSIS — Z6821 Body mass index (BMI) 21.0-21.9, adult: Secondary | ICD-10-CM | POA: Diagnosis not present

## 2023-09-16 DIAGNOSIS — Z01419 Encounter for gynecological examination (general) (routine) without abnormal findings: Secondary | ICD-10-CM | POA: Diagnosis not present

## 2023-12-14 DIAGNOSIS — F1090 Alcohol use, unspecified, uncomplicated: Secondary | ICD-10-CM | POA: Diagnosis not present

## 2023-12-14 DIAGNOSIS — Z820 Family history of epilepsy and other diseases of the nervous system: Secondary | ICD-10-CM | POA: Diagnosis not present

## 2023-12-14 DIAGNOSIS — Z79899 Other long term (current) drug therapy: Secondary | ICD-10-CM | POA: Diagnosis not present

## 2023-12-14 DIAGNOSIS — Z0000 Encounter for general adult medical examination without abnormal findings: Secondary | ICD-10-CM | POA: Diagnosis not present

## 2024-02-02 DIAGNOSIS — Z1231 Encounter for screening mammogram for malignant neoplasm of breast: Secondary | ICD-10-CM | POA: Diagnosis not present
